# Patient Record
Sex: Male | Born: 1950 | Race: White | Hispanic: No | Smoking: Never smoker
Health system: Southern US, Community
[De-identification: ages and names within clinical notes are randomized; demographics above are authoritative.]

## PROBLEM LIST (undated history)

## (undated) DIAGNOSIS — E785 Hyperlipidemia, unspecified: Secondary | ICD-10-CM

## (undated) DIAGNOSIS — I10 Essential (primary) hypertension: Secondary | ICD-10-CM

## (undated) DIAGNOSIS — E119 Type 2 diabetes mellitus without complications: Secondary | ICD-10-CM

## (undated) HISTORY — PX: TONSILLECTOMY: SUR1361

## (undated) HISTORY — PX: FINGER AMPUTATION: SHX636

---

## 2010-03-01 ENCOUNTER — Encounter (INDEPENDENT_AMBULATORY_CARE_PROVIDER_SITE_OTHER): Payer: Self-pay | Admitting: *Deleted

## 2010-09-17 NOTE — Letter (Signed)
Summary: [facility] Referral (unable to schedule) Notification  [facility]  [address] [place], [place] [address]   Phone: [phone]  Fax: [phone]      March 01, 2010 Martin Leach 04/22/1951 MRN: [id number]   [facility] [address] [place] [address] [address] [place], [place]  [address]   Dear Dr. Clelia Croft:   Thank you for your kind referral of the above patient. We have attempted to schedule the recommended Colonoscopy but have been unable to schedule because:  _x_ The patient was not available by phone and/or has not returned our calls.  __ The patient declined to schedule the procedure at this time.  We appreciate the referral and hope that we will have the opportunity to treat this patient in the future.    Sincerely,   [facility]  Vania Rea. Jarold Motto M.D. Hedwig Morton. Juanda Chance M.D. Venita Lick. Russella Dar M.D. Wilhemina Bonito. Marina Goodell M.D. Barbette Hair. Arlyce Dice M.D. Iva Boop M.D. Cheron Every.D.

## 2013-12-15 DIAGNOSIS — H3589 Other specified retinal disorders: Secondary | ICD-10-CM | POA: Diagnosis not present

## 2013-12-15 DIAGNOSIS — H52229 Regular astigmatism, unspecified eye: Secondary | ICD-10-CM | POA: Diagnosis not present

## 2013-12-15 DIAGNOSIS — H5200 Hypermetropia, unspecified eye: Secondary | ICD-10-CM | POA: Diagnosis not present

## 2013-12-15 DIAGNOSIS — H524 Presbyopia: Secondary | ICD-10-CM | POA: Diagnosis not present

## 2014-01-03 ENCOUNTER — Encounter: Payer: Self-pay | Admitting: Internal Medicine

## 2014-02-22 ENCOUNTER — Encounter: Payer: Self-pay | Admitting: Internal Medicine

## 2014-04-19 DIAGNOSIS — M25569 Pain in unspecified knee: Secondary | ICD-10-CM | POA: Diagnosis not present

## 2014-05-02 DIAGNOSIS — E1169 Type 2 diabetes mellitus with other specified complication: Secondary | ICD-10-CM | POA: Diagnosis not present

## 2014-05-02 DIAGNOSIS — Z23 Encounter for immunization: Secondary | ICD-10-CM | POA: Diagnosis not present

## 2014-05-02 DIAGNOSIS — M25569 Pain in unspecified knee: Secondary | ICD-10-CM | POA: Diagnosis not present

## 2014-05-02 DIAGNOSIS — E785 Hyperlipidemia, unspecified: Secondary | ICD-10-CM | POA: Diagnosis not present

## 2014-05-02 DIAGNOSIS — I10 Essential (primary) hypertension: Secondary | ICD-10-CM | POA: Diagnosis not present

## 2016-08-21 DIAGNOSIS — E784 Other hyperlipidemia: Secondary | ICD-10-CM | POA: Diagnosis not present

## 2016-08-21 DIAGNOSIS — R609 Edema, unspecified: Secondary | ICD-10-CM | POA: Diagnosis not present

## 2016-08-21 DIAGNOSIS — Z6841 Body Mass Index (BMI) 40.0 and over, adult: Secondary | ICD-10-CM | POA: Diagnosis not present

## 2016-08-21 DIAGNOSIS — E1129 Type 2 diabetes mellitus with other diabetic kidney complication: Secondary | ICD-10-CM | POA: Diagnosis not present

## 2016-08-21 DIAGNOSIS — I10 Essential (primary) hypertension: Secondary | ICD-10-CM | POA: Diagnosis not present

## 2016-10-02 DIAGNOSIS — S82402A Unspecified fracture of shaft of left fibula, initial encounter for closed fracture: Secondary | ICD-10-CM | POA: Diagnosis not present

## 2016-10-03 DIAGNOSIS — S82892A Other fracture of left lower leg, initial encounter for closed fracture: Secondary | ICD-10-CM | POA: Diagnosis not present

## 2016-10-03 DIAGNOSIS — M25562 Pain in left knee: Secondary | ICD-10-CM | POA: Diagnosis not present

## 2016-10-21 DIAGNOSIS — H5203 Hypermetropia, bilateral: Secondary | ICD-10-CM | POA: Diagnosis not present

## 2016-10-21 DIAGNOSIS — Z7984 Long term (current) use of oral hypoglycemic drugs: Secondary | ICD-10-CM | POA: Diagnosis not present

## 2016-10-21 DIAGNOSIS — E119 Type 2 diabetes mellitus without complications: Secondary | ICD-10-CM | POA: Diagnosis not present

## 2016-10-21 DIAGNOSIS — H35033 Hypertensive retinopathy, bilateral: Secondary | ICD-10-CM | POA: Diagnosis not present

## 2016-10-21 DIAGNOSIS — H52223 Regular astigmatism, bilateral: Secondary | ICD-10-CM | POA: Diagnosis not present

## 2016-10-21 DIAGNOSIS — H2513 Age-related nuclear cataract, bilateral: Secondary | ICD-10-CM | POA: Diagnosis not present

## 2016-10-21 DIAGNOSIS — H40033 Anatomical narrow angle, bilateral: Secondary | ICD-10-CM | POA: Diagnosis not present

## 2016-10-21 DIAGNOSIS — H25013 Cortical age-related cataract, bilateral: Secondary | ICD-10-CM | POA: Diagnosis not present

## 2016-10-21 DIAGNOSIS — H25043 Posterior subcapsular polar age-related cataract, bilateral: Secondary | ICD-10-CM | POA: Diagnosis not present

## 2016-10-21 DIAGNOSIS — Z794 Long term (current) use of insulin: Secondary | ICD-10-CM | POA: Diagnosis not present

## 2016-10-21 DIAGNOSIS — H04123 Dry eye syndrome of bilateral lacrimal glands: Secondary | ICD-10-CM | POA: Diagnosis not present

## 2016-10-21 DIAGNOSIS — I10 Essential (primary) hypertension: Secondary | ICD-10-CM | POA: Diagnosis not present

## 2016-10-31 DIAGNOSIS — M25572 Pain in left ankle and joints of left foot: Secondary | ICD-10-CM | POA: Diagnosis not present

## 2016-10-31 DIAGNOSIS — S82832G Other fracture of upper and lower end of left fibula, subsequent encounter for closed fracture with delayed healing: Secondary | ICD-10-CM | POA: Diagnosis not present

## 2016-11-07 DIAGNOSIS — S8262XD Displaced fracture of lateral malleolus of left fibula, subsequent encounter for closed fracture with routine healing: Secondary | ICD-10-CM | POA: Diagnosis not present

## 2016-11-21 DIAGNOSIS — S8262XD Displaced fracture of lateral malleolus of left fibula, subsequent encounter for closed fracture with routine healing: Secondary | ICD-10-CM | POA: Diagnosis not present

## 2016-12-19 DIAGNOSIS — Z6841 Body Mass Index (BMI) 40.0 and over, adult: Secondary | ICD-10-CM | POA: Diagnosis not present

## 2016-12-19 DIAGNOSIS — J3089 Other allergic rhinitis: Secondary | ICD-10-CM | POA: Diagnosis not present

## 2016-12-19 DIAGNOSIS — E784 Other hyperlipidemia: Secondary | ICD-10-CM | POA: Diagnosis not present

## 2016-12-19 DIAGNOSIS — S8262XD Displaced fracture of lateral malleolus of left fibula, subsequent encounter for closed fracture with routine healing: Secondary | ICD-10-CM | POA: Diagnosis not present

## 2016-12-19 DIAGNOSIS — I10 Essential (primary) hypertension: Secondary | ICD-10-CM | POA: Diagnosis not present

## 2016-12-19 DIAGNOSIS — S82402A Unspecified fracture of shaft of left fibula, initial encounter for closed fracture: Secondary | ICD-10-CM | POA: Diagnosis not present

## 2016-12-19 DIAGNOSIS — E1129 Type 2 diabetes mellitus with other diabetic kidney complication: Secondary | ICD-10-CM | POA: Diagnosis not present

## 2017-05-07 DIAGNOSIS — E784 Other hyperlipidemia: Secondary | ICD-10-CM | POA: Diagnosis not present

## 2017-05-07 DIAGNOSIS — Z125 Encounter for screening for malignant neoplasm of prostate: Secondary | ICD-10-CM | POA: Diagnosis not present

## 2017-05-07 DIAGNOSIS — E1129 Type 2 diabetes mellitus with other diabetic kidney complication: Secondary | ICD-10-CM | POA: Diagnosis not present

## 2017-05-07 DIAGNOSIS — I10 Essential (primary) hypertension: Secondary | ICD-10-CM | POA: Diagnosis not present

## 2017-05-07 DIAGNOSIS — N390 Urinary tract infection, site not specified: Secondary | ICD-10-CM | POA: Diagnosis not present

## 2017-05-07 DIAGNOSIS — R8299 Other abnormal findings in urine: Secondary | ICD-10-CM | POA: Diagnosis not present

## 2017-05-14 DIAGNOSIS — Z6841 Body Mass Index (BMI) 40.0 and over, adult: Secondary | ICD-10-CM | POA: Diagnosis not present

## 2017-05-14 DIAGNOSIS — E1129 Type 2 diabetes mellitus with other diabetic kidney complication: Secondary | ICD-10-CM | POA: Diagnosis not present

## 2017-05-14 DIAGNOSIS — I10 Essential (primary) hypertension: Secondary | ICD-10-CM | POA: Diagnosis not present

## 2017-05-14 DIAGNOSIS — S82402S Unspecified fracture of shaft of left fibula, sequela: Secondary | ICD-10-CM | POA: Diagnosis not present

## 2017-05-14 DIAGNOSIS — Z0000 Encounter for general adult medical examination without abnormal findings: Secondary | ICD-10-CM | POA: Diagnosis not present

## 2017-05-14 DIAGNOSIS — E784 Other hyperlipidemia: Secondary | ICD-10-CM | POA: Diagnosis not present

## 2017-05-14 DIAGNOSIS — R609 Edema, unspecified: Secondary | ICD-10-CM | POA: Diagnosis not present

## 2017-05-14 DIAGNOSIS — Z23 Encounter for immunization: Secondary | ICD-10-CM | POA: Diagnosis not present

## 2017-05-14 DIAGNOSIS — Z1389 Encounter for screening for other disorder: Secondary | ICD-10-CM | POA: Diagnosis not present

## 2017-06-09 DIAGNOSIS — R3129 Other microscopic hematuria: Secondary | ICD-10-CM | POA: Diagnosis not present

## 2017-06-25 DIAGNOSIS — R32 Unspecified urinary incontinence: Secondary | ICD-10-CM | POA: Diagnosis not present

## 2017-06-25 DIAGNOSIS — N390 Urinary tract infection, site not specified: Secondary | ICD-10-CM | POA: Diagnosis not present

## 2017-09-11 DIAGNOSIS — E7849 Other hyperlipidemia: Secondary | ICD-10-CM | POA: Diagnosis not present

## 2017-09-11 DIAGNOSIS — I10 Essential (primary) hypertension: Secondary | ICD-10-CM | POA: Diagnosis not present

## 2017-09-11 DIAGNOSIS — E1129 Type 2 diabetes mellitus with other diabetic kidney complication: Secondary | ICD-10-CM | POA: Diagnosis not present

## 2017-09-11 DIAGNOSIS — Z6841 Body Mass Index (BMI) 40.0 and over, adult: Secondary | ICD-10-CM | POA: Diagnosis not present

## 2017-10-21 DIAGNOSIS — R310 Gross hematuria: Secondary | ICD-10-CM | POA: Diagnosis not present

## 2017-10-21 DIAGNOSIS — N390 Urinary tract infection, site not specified: Secondary | ICD-10-CM | POA: Diagnosis not present

## 2017-12-26 ENCOUNTER — Other Ambulatory Visit: Payer: Self-pay

## 2017-12-26 ENCOUNTER — Inpatient Hospital Stay (HOSPITAL_COMMUNITY)
Admission: EM | Admit: 2017-12-26 | Discharge: 2017-12-29 | DRG: 872 | Disposition: A | Discharge status: Home Health | Payer: PPO | Attending: Internal Medicine | Admitting: Internal Medicine

## 2017-12-26 ENCOUNTER — Encounter (HOSPITAL_COMMUNITY): Payer: Self-pay

## 2017-12-26 ENCOUNTER — Emergency Department (HOSPITAL_COMMUNITY): Payer: PPO

## 2017-12-26 DIAGNOSIS — D649 Anemia, unspecified: Secondary | ICD-10-CM | POA: Diagnosis present

## 2017-12-26 DIAGNOSIS — E785 Hyperlipidemia, unspecified: Secondary | ICD-10-CM | POA: Diagnosis not present

## 2017-12-26 DIAGNOSIS — R748 Abnormal levels of other serum enzymes: Secondary | ICD-10-CM | POA: Diagnosis not present

## 2017-12-26 DIAGNOSIS — E861 Hypovolemia: Secondary | ICD-10-CM

## 2017-12-26 DIAGNOSIS — Z8249 Family history of ischemic heart disease and other diseases of the circulatory system: Secondary | ICD-10-CM | POA: Diagnosis not present

## 2017-12-26 DIAGNOSIS — R000 Tachycardia, unspecified: Secondary | ICD-10-CM

## 2017-12-26 DIAGNOSIS — I2510 Atherosclerotic heart disease of native coronary artery without angina pectoris: Secondary | ICD-10-CM | POA: Diagnosis not present

## 2017-12-26 DIAGNOSIS — I10 Essential (primary) hypertension: Secondary | ICD-10-CM | POA: Diagnosis present

## 2017-12-26 DIAGNOSIS — N179 Acute kidney failure, unspecified: Secondary | ICD-10-CM | POA: Diagnosis not present

## 2017-12-26 DIAGNOSIS — D72825 Bandemia: Secondary | ICD-10-CM

## 2017-12-26 DIAGNOSIS — Z7984 Long term (current) use of oral hypoglycemic drugs: Secondary | ICD-10-CM

## 2017-12-26 DIAGNOSIS — Z79899 Other long term (current) drug therapy: Secondary | ICD-10-CM | POA: Diagnosis not present

## 2017-12-26 DIAGNOSIS — Z7982 Long term (current) use of aspirin: Secondary | ICD-10-CM | POA: Diagnosis not present

## 2017-12-26 DIAGNOSIS — A419 Sepsis, unspecified organism: Secondary | ICD-10-CM | POA: Diagnosis not present

## 2017-12-26 DIAGNOSIS — E1169 Type 2 diabetes mellitus with other specified complication: Secondary | ICD-10-CM | POA: Diagnosis not present

## 2017-12-26 DIAGNOSIS — R778 Other specified abnormalities of plasma proteins: Secondary | ICD-10-CM

## 2017-12-26 DIAGNOSIS — I959 Hypotension, unspecified: Secondary | ICD-10-CM | POA: Diagnosis present

## 2017-12-26 DIAGNOSIS — E876 Hypokalemia: Secondary | ICD-10-CM | POA: Diagnosis present

## 2017-12-26 DIAGNOSIS — R296 Repeated falls: Secondary | ICD-10-CM | POA: Diagnosis not present

## 2017-12-26 DIAGNOSIS — N390 Urinary tract infection, site not specified: Secondary | ICD-10-CM | POA: Diagnosis present

## 2017-12-26 DIAGNOSIS — R7989 Other specified abnormal findings of blood chemistry: Secondary | ICD-10-CM

## 2017-12-26 DIAGNOSIS — W19XXXA Unspecified fall, initial encounter: Secondary | ICD-10-CM | POA: Diagnosis not present

## 2017-12-26 DIAGNOSIS — E669 Obesity, unspecified: Secondary | ICD-10-CM

## 2017-12-26 DIAGNOSIS — Z6841 Body Mass Index (BMI) 40.0 and over, adult: Secondary | ICD-10-CM

## 2017-12-26 DIAGNOSIS — R531 Weakness: Secondary | ICD-10-CM | POA: Diagnosis not present

## 2017-12-26 DIAGNOSIS — E860 Dehydration: Secondary | ICD-10-CM | POA: Diagnosis not present

## 2017-12-26 DIAGNOSIS — E872 Acidosis, unspecified: Secondary | ICD-10-CM | POA: Diagnosis present

## 2017-12-26 DIAGNOSIS — Z89022 Acquired absence of left finger(s): Secondary | ICD-10-CM

## 2017-12-26 DIAGNOSIS — I9589 Other hypotension: Secondary | ICD-10-CM | POA: Diagnosis present

## 2017-12-26 DIAGNOSIS — R404 Transient alteration of awareness: Secondary | ICD-10-CM | POA: Diagnosis not present

## 2017-12-26 DIAGNOSIS — Y92009 Unspecified place in unspecified non-institutional (private) residence as the place of occurrence of the external cause: Secondary | ICD-10-CM

## 2017-12-26 DIAGNOSIS — R0989 Other specified symptoms and signs involving the circulatory and respiratory systems: Secondary | ICD-10-CM | POA: Diagnosis not present

## 2017-12-26 DIAGNOSIS — I2699 Other pulmonary embolism without acute cor pulmonale: Secondary | ICD-10-CM | POA: Diagnosis not present

## 2017-12-26 HISTORY — DX: Essential (primary) hypertension: I10

## 2017-12-26 HISTORY — DX: Hyperlipidemia, unspecified: E78.5

## 2017-12-26 HISTORY — DX: Type 2 diabetes mellitus without complications: E11.9

## 2017-12-26 LAB — COMPREHENSIVE METABOLIC PANEL
ALBUMIN: 3.2 g/dL — AB (ref 3.5–5.0)
ALK PHOS: 66 U/L (ref 38–126)
ALT: 25 U/L (ref 17–63)
AST: 25 U/L (ref 15–41)
Anion gap: 14 (ref 5–15)
BILIRUBIN TOTAL: 0.4 mg/dL (ref 0.3–1.2)
BUN: 29 mg/dL — AB (ref 6–20)
CO2: 22 mmol/L (ref 22–32)
CREATININE: 1.46 mg/dL — AB (ref 0.61–1.24)
Calcium: 8.9 mg/dL (ref 8.9–10.3)
Chloride: 105 mmol/L (ref 101–111)
GFR calc Af Amer: 55 mL/min — ABNORMAL LOW (ref >60)
GFR, EST NON AFRICAN AMERICAN: 47 mL/min — AB (ref >60)
GLUCOSE: 297 mg/dL — AB (ref 65–99)
POTASSIUM: 3.7 mmol/L (ref 3.5–5.1)
Sodium: 141 mmol/L (ref 135–145)
TOTAL PROTEIN: 6.6 g/dL (ref 6.5–8.1)

## 2017-12-26 LAB — GLUCOSE, CAPILLARY: GLUCOSE-CAPILLARY: 250 mg/dL — AB (ref 65–99)

## 2017-12-26 LAB — CBC WITH DIFFERENTIAL/PLATELET
BASOS ABS: 0 10*3/uL (ref 0.0–0.1)
Basophils Relative: 0 %
EOS PCT: 0 %
Eosinophils Absolute: 0 10*3/uL (ref 0.0–0.7)
HCT: 37.8 % — ABNORMAL LOW (ref 39.0–52.0)
Hemoglobin: 12.1 g/dL — ABNORMAL LOW (ref 13.0–17.0)
LYMPHS ABS: 0.9 10*3/uL (ref 0.7–4.0)
Lymphocytes Relative: 6 %
MCH: 31.2 pg (ref 26.0–34.0)
MCHC: 32 g/dL (ref 30.0–36.0)
MCV: 97.4 fL (ref 78.0–100.0)
MONO ABS: 0.9 10*3/uL (ref 0.1–1.0)
MONOS PCT: 6 %
Neutro Abs: 13.2 10*3/uL — ABNORMAL HIGH (ref 1.7–7.7)
Neutrophils Relative %: 88 %
PLATELETS: 282 10*3/uL (ref 150–400)
RBC: 3.88 MIL/uL — ABNORMAL LOW (ref 4.22–5.81)
RDW: 14.7 % (ref 11.5–15.5)
WBC: 15 10*3/uL — ABNORMAL HIGH (ref 4.0–10.5)

## 2017-12-26 LAB — URINALYSIS, ROUTINE W REFLEX MICROSCOPIC
Bilirubin Urine: NEGATIVE
GLUCOSE, UA: 50 mg/dL — AB
KETONES UR: NEGATIVE mg/dL
Nitrite: NEGATIVE
PROTEIN: 30 mg/dL — AB
Specific Gravity, Urine: 1.019 (ref 1.005–1.030)
WBC, UA: >50 WBC/hpf — ABNORMAL HIGH (ref 0–5)
pH: 5 (ref 5.0–8.0)

## 2017-12-26 LAB — BRAIN NATRIURETIC PEPTIDE: B NATRIURETIC PEPTIDE 5: 88.4 pg/mL (ref 0.0–100.0)

## 2017-12-26 LAB — CBG MONITORING, ED: Glucose-Capillary: 273 mg/dL — ABNORMAL HIGH (ref 65–99)

## 2017-12-26 LAB — TROPONIN I: Troponin I: 0.07 ng/mL (ref <0.03)

## 2017-12-26 LAB — I-STAT TROPONIN, ED: Troponin i, poc: 0.05 ng/mL (ref 0.00–0.08)

## 2017-12-26 LAB — I-STAT CG4 LACTIC ACID, ED: LACTIC ACID, VENOUS: 3.84 mmol/L — AB (ref 0.5–1.9)

## 2017-12-26 LAB — LACTIC ACID, PLASMA: LACTIC ACID, VENOUS: 2 mmol/L — AB (ref 0.5–1.9)

## 2017-12-26 LAB — D-DIMER, QUANTITATIVE: D-Dimer, Quant: 2.79 ug/mL-FEU — ABNORMAL HIGH (ref 0.00–0.50)

## 2017-12-26 LAB — LIPASE, BLOOD: Lipase: 24 U/L (ref 11–51)

## 2017-12-26 MED ORDER — VANCOMYCIN HCL 10 G IV SOLR: 1750.0000 mg | INTRAVENOUS | Status: DC

## 2017-12-26 MED ORDER — PIPERACILLIN-TAZOBACTAM 3.375 G IVPB 30 MIN
3.3750 g | Freq: Once | INTRAVENOUS | Status: AC
  Administered 2017-12-26: 3.375 g via INTRAVENOUS
  Filled 2017-12-26: qty 50

## 2017-12-26 MED ORDER — INSULIN ASPART 100 UNIT/ML ~~LOC~~ SOLN: 0.0000 [IU] | Freq: Every day | SUBCUTANEOUS | Status: DC

## 2017-12-26 MED ORDER — SODIUM CHLORIDE 0.9 % IV BOLUS
1000.0000 mL | Freq: Once | INTRAVENOUS | Status: AC
  Administered 2017-12-26: 1000 mL via INTRAVENOUS

## 2017-12-26 MED ORDER — SODIUM CHLORIDE 0.9 % IV SOLN
INTRAVENOUS | Status: AC
  Administered 2017-12-26: 22:00:00 via INTRAVENOUS

## 2017-12-26 MED ORDER — PIPERACILLIN-TAZOBACTAM 3.375 G IVPB
3.3750 g | Freq: Three times a day (TID) | INTRAVENOUS | Status: DC
  Administered 2017-12-27 – 2017-12-29 (×7): 3.375 g via INTRAVENOUS
  Filled 2017-12-26 (×7): qty 50

## 2017-12-26 MED ORDER — ROSUVASTATIN CALCIUM 20 MG PO TABS
40.0000 mg | ORAL_TABLET | Freq: Every day | ORAL | Status: DC
  Administered 2017-12-27 – 2017-12-29 (×3): 40 mg via ORAL
  Filled 2017-12-26 (×5): qty 2

## 2017-12-26 MED ORDER — INSULIN ASPART 100 UNIT/ML ~~LOC~~ SOLN
0.0000 [IU] | Freq: Three times a day (TID) | SUBCUTANEOUS | Status: DC
  Administered 2017-12-27 – 2017-12-28 (×2): 2 [IU] via SUBCUTANEOUS
  Administered 2017-12-28: 3 [IU] via SUBCUTANEOUS

## 2017-12-26 MED ORDER — ENOXAPARIN SODIUM 80 MG/0.8ML ~~LOC~~ SOLN
80.0000 mg | Freq: Every day | SUBCUTANEOUS | Status: DC
  Administered 2017-12-26: 80 mg via SUBCUTANEOUS
  Filled 2017-12-26: qty 0.8

## 2017-12-26 MED ORDER — VANCOMYCIN HCL IN DEXTROSE 1-5 GM/200ML-% IV SOLN
1000.0000 mg | Freq: Once | INTRAVENOUS | Status: AC
  Administered 2017-12-26: 1000 mg via INTRAVENOUS
  Filled 2017-12-26: qty 200

## 2017-12-26 MED ORDER — ACETAMINOPHEN 325 MG PO TABS
650.0000 mg | ORAL_TABLET | Freq: Four times a day (QID) | ORAL | Status: DC | PRN
  Administered 2017-12-26 – 2017-12-28 (×4): 650 mg via ORAL
  Filled 2017-12-26 (×4): qty 2

## 2017-12-26 MED ORDER — ACETAMINOPHEN 650 MG RE SUPP: 650.0000 mg | Freq: Four times a day (QID) | RECTAL | Status: DC | PRN

## 2017-12-26 MED ORDER — ASPIRIN EC 81 MG PO TBEC
81.0000 mg | DELAYED_RELEASE_TABLET | Freq: Every day | ORAL | Status: DC
  Administered 2017-12-27: 81 mg via ORAL
  Filled 2017-12-26: qty 1

## 2017-12-26 NOTE — ED Triage Notes (Signed)
Pt's. Wife called EMS d/t pt. Larey Seat in his home. He denies any specific injury. He tells [place] he has fallen three times recently for "unknown" reasons. EMS found his initial sbp of 73. After of IVF, his sbp 92. Pt. Denies fever/cough, nor any other sign of current illness.

## 2017-12-26 NOTE — Progress Notes (Signed)
Pharmacy Antibiotic Note  Martin Leach is a 67 y.o. male admitted on 12/26/2017 after falling at home. Also chills and showing signs of sepsis. Pharmacy has been consulted for Vanc and Zosyn dosing. First doses have been given in the ED.  Plan: Start Zosyn 3.375g IV Q8H infused over 4hrs. Give Vancomycin 1g in addition to 1g already given for a 2g loading dose,  Then start Vanc  IV q24h.  Measure Vanc peak and trough at steady state. Target AUC 400-500. Follow up renal fxn, culture results, and clinical course.   Height:  (185.4 cm) Weight: (!) 391 lb (177.4 kg) IBW/kg (Calculated) : 79.9  Temp (24hrs), Avg:98.3 F (36.8 C), Min:98.3 F (36.8 C), Max:98.3 F (36.8 C)  Recent Labs  Lab 12/26/17 1612 12/26/17 1622  WBC  --  15.0*  CREATININE  --  1.46*  LATICACIDVEN 3.84*  --     Estimated Creatinine Clearance: 80.3 mL/min (A) (by C-G formula based on SCr of 1.46 mg/dL (H)).    Allergies  Allergen Reactions  . Sulfa Antibiotics     Antimicrobials this admission: Zosyn 5/11 >>  Vanc 5/11 >>   Dose adjustments this admission:   Microbiology results: BCx: UCx:  Sputum:   MRSA PCR:   Thank you for allowing pharmacy to be a part of this patient's care.  Charolotte Eke, PharmD. Mobile: [phone]. 12/26/2017,8:50 PM.

## 2017-12-26 NOTE — ED Notes (Signed)
MADE 1ST URINE REQUEST PQTIENT UNABLE TO PROVIDE ONE AT THIS TIME

## 2017-12-26 NOTE — ED Notes (Signed)
No respiratory or acute distress noted alert and oriented x 3 family at bedside no reaction to medication noted. 

## 2017-12-26 NOTE — H&P (Addendum)
TRH H&P   Patient Demographics:    Martin Leach, is a 67 y.o. male  MRN: [id number]   DOB - Feb 18, 1951  Admit Date - 12/26/2017  Outpatient Primary MD for the patient is Martha Clan, MD  Referring MD/NP/PA:  Toni Arthurs (orthopedics)  Outpatient Specialists:   Patient coming from: home  Chief Complaint  Patient presents with  . Fall      HPI:    Martin Leach  is a 67 y.o. male, w hypertension, hyperlipidemia, dm2, presents with chills and fall while getting out of bed. Pt presented due to fall.  He scraped his legs.  Pt denies fever, cough, syncope, cp, palp, sob, n/v, diarrhea, dysuria, hematuria.  In ED,  CXR IMPRESSION: Cardiomegaly with mild pulmonary vascular congestion.  Lactic 3.84 Wbc 15.0, Hgb 12.1, Plt 282 Bun 29, Creatinine 1.46 Ast 25, Alt 25 Alb 3.2 Glucose 297 Lipase 24 BNP 88.4 Trop negative  Blood culture x2 negative D dimer pending  Pt will be admitted for chills/ shaking and hypotension and renal insufficiency/ dehydration   Review of systems:    In addition to the HPI above, No Fever-chills, No Headache, No changes with Vision or hearing, No problems swallowing food or Liquids, No Chest pain, Cough or Shortness of Breath, No Abdominal pain, No Nausea or Vommitting, Bowel movements are regular, No Blood in stool or Urine, No dysuria, + rug burn No new joints pains-aches,  No new weakness, tingling, numbness in any extremity, No recent weight gain or loss, No polyuria, polydypsia or polyphagia, No significant Mental Stressors.  A full 10 point Review of Systems was done, except as stated above, all other Review of Systems were negative.   With Past History of the following :    Past Medical History:  Diagnosis Date  . Diabetes mellitus without complication (HCC)   . Hyperlipidemia   . Hypertension       Past Surgical  History:  Procedure Laterality Date  . 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 TONSILLECTOMY        Social History:     Social History   Tobacco Use  . Smoking status: Never Smoker  . Smokeless tobacco: Never Used  Substance Use Topics  . Alcohol use: Yes    Comment: occasional cold beer     Lives - at home  Mobility - walks by self   Family History :     Family History  Problem Relation Age of Onset  . Heart attack Mother   . Stroke Mother   . Stroke Father       Home Medications:   Prior to Admission medications   Medication Sig Start Date End Date Taking? Authorizing Provider  aspirin EC 81 MG tablet Take 81 mg by mouth daily.   Yes [provider]  Dulaglutide (TRULICITY [place]) Inject into the skin.  Yes [provider]  lisinopril-hydrochlorothiazide (PRINZIDE,ZESTORETIC) 20-12.5 MG tablet Take 1 tablet by mouth daily.  11/30/17  Yes [provider]  metFORMIN (GLUCOPHAGE) 1000 MG tablet Take 1,000 mg by mouth daily with breakfast.   Yes [provider]  Multiple Vitamins-Minerals (MULTIVITAMIN ADULTS) TABS Take 1 tablet by mouth daily.   Yes [provider]  pioglitazone (ACTOS) 45 MG tablet Take 45 mg by mouth daily.  11/30/17  Yes [provider]  rosuvastatin (CRESTOR) 40 MG tablet Take 40 mg by mouth daily.  12/14/17  Yes [provider]     Allergies:     Allergies  Allergen Reactions  . Sulfa Antibiotics      Physical Exam:   Vitals  Blood pressure 106/60, pulse 93, temperature 98.3 F (36.8 C), temperature source Oral, resp. rate 19, height  (1.854 m), weight (!) 177.4 kg (391 lb), SpO2 97 %.   1. General  lying in bed in NAD,    2. Normal affect and insight, Not Suicidal or Homicidal, Awake Alert, Oriented X 3.  3. No F.N deficits, ALL C.Nerves Intact, Strength 5/5 all 4 extremities, Sensation intact all 4 extremities, Plantars down going.  4. Ears and Eyes appear  Normal, Conjunctivae clear, PERRLA. Moist Oral Mucosa.  5. Supple Neck, No JVD, No cervical lymphadenopathy appriciated, No Carotid Bruits.  6. Symmetrical Chest wall movement, Good air movement bilaterally, CTAB.  7. RRR, No Gallops, Rubs or Murmurs, No Parasternal Heave.  8. Positive Bowel Sounds, Abdomen Soft, No tenderness, No organomegaly appriciated,No rebound -guarding or rigidity.  9.  No Cyanosis, Normal Skin Turgor, No Skin Rash or Bruise.  10. Good muscle tone,  joints appear normal , no effusions, Normal ROM.  11. No Palpable Lymph Nodes in Neck or Axillae  Scrape over the bilateral    Data Review:    CBC Recent Labs  Lab 12/26/17 1622  WBC 15.0*  HGB 12.1*  HCT 37.8*  PLT 282  MCV 97.4  MCH 31.2  MCHC 32.0  RDW 14.7  LYMPHSABS 0.9  MONOABS 0.9  EOSABS 0.0  BASOSABS 0.0   ------------------------------------------------------------------------------------------------------------------  Chemistries  Recent Labs  Lab 12/26/17 1622  NA 141  K 3.7  CL 105  CO2 22  GLUCOSE 297*  BUN 29*  CREATININE 1.46*  CALCIUM 8.9  AST 25  ALT 25  ALKPHOS 66  BILITOT 0.4   ------------------------------------------------------------------------------------------------------------------ estimated creatinine clearance is 80.3 mL/min (A) (by C-G formula based on SCr of 1.46 mg/dL (H)). ------------------------------------------------------------------------------------------------------------------ No results for input(s): TSH, T4TOTAL, T3FREE, THYROIDAB in the last 72 hours.  Invalid input(s): FREET3  Coagulation profile No results for input(s): INR, PROTIME in the last 168 hours. ------------------------------------------------------------------------------------------------------------------- No results for input(s): DDIMER in the last 72  hours. -------------------------------------------------------------------------------------------------------------------  Cardiac Enzymes No results for input(s): CKMB, TROPONINI, MYOGLOBIN in the last 168 hours.  Invalid input(s): CK ------------------------------------------------------------------------------------------------------------------    Component Value Date/Time   BNP 88.4 12/26/2017 1622     ---------------------------------------------------------------------------------------------------------------  Urinalysis No results found for: COLORURINE, APPEARANCEUR, LABSPEC, PHURINE, GLUCOSEU, HGBUR, BILIRUBINUR, KETONESUR, PROTEINUR, UROBILINOGEN, NITRITE, LEUKOCYTESUR  ----------------------------------------------------------------------------------------------------------------   Imaging Results:    Dg Chest Port 1 View  Result Date: 12/26/2017 CLINICAL DATA:  Tachycardia EXAM: PORTABLE CHEST 1 VIEW COMPARISON:  None. FINDINGS: Cardiomegaly and mild pulmonary vascular congestion noted. There is no evidence of focal airspace disease, pulmonary edema, suspicious pulmonary nodule/mass, pleural effusion, or pneumothorax. No acute bony abnormalities are identified. IMPRESSION: Cardiomegaly with mild pulmonary vascular congestion. Electronically  Signed   By: Harmon Pier M.D.   On: 12/26/2017 18:02    STat 100, borderline LAD, early R progression no st-t changes c/w ischemia    Assessment & Plan:    Active Problems:   Hypotension    Hypotension Tele Cortisol Trop I q6h x3 Check cardiac echo  Tachycardia Check tsh Check D dimer If D dimer is positive then consider VQ scan  Chills/ shaking  ? Early sepsis,  (wbc elevation, tachycardia, elevation in LA Blood culture x2 Urinalysis pending Vanco, zosyn iv pharmacy to dose for now  Dm2 STOP metformin due to renal insufficiency STOP actos for now as well as trulicity fsbs ac and qhs, ISS  Lactic acidosis  might be due to being on metformin and renal insufficiency Check lactic acid in am   Hypertension HOLD Lisinopril/ hydrochlorothiazide due to hypotension, and dehydration  Hyperlipidemia Cont crestor  po qhs      DVT Prophylaxis  Lovenox - SCDs  AM Labs Ordered, also please review Full Orders  Family Communication: Admission, patients condition and plan of care including tests being ordered have been discussed with the patient who indicate understanding and agree with the plan and Code Status.  Code Status FULLCODE  Likely DC to  home  Condition GUARDED    Consults called: none  Admission status: inpatient  Time spent in minutes : 45  Pearson Grippe M.D on 12/26/2017 at 7:32 PM  Between 7am to 7pm - Pager - [phone]. After 7pm go to [email or web address] - password [facility]  [facility] - Office  [phone]

## 2017-12-26 NOTE — Progress Notes (Signed)
A consult was received from an ED physician for vanc/Zosyn per pharmacy dosing.  The patient's profile has been reviewed for ht/wt/allergies/indication/available labs.   A one time order has been placed for Vanc 1g and Zosyn 3.375g.  Further antibiotics/pharmacy consults should be ordered by admitting physician if indicated.                       Thank you, Berkley Harvey 12/26/2017  5:35 PM

## 2017-12-26 NOTE — ED Notes (Signed)
ED TO INPATIENT HANDOFF REPORT  Name/Age/Gender Martin Leach 67 y.o. male  Code Status   Home/SNF/Other Home  Chief Complaint hypotension  Level of Care/Admitting Diagnosis ED Disposition    ED Disposition Condition [facility] Area: [place] [100102]  Level of Care: Stepdown [14]  Admit to SDU based on following criteria: Hemodynamic compromise or significant risk of instability:  Patient requiring short term acute titration and management of vasoactive drips, and invasive monitoring (i.e., CVP and Arterial line).  Diagnosis: Hypotension [494496]  Admitting Physician: Jani Gravel [[id number]]  Attending Physician: Jani Gravel [phone]  Estimated length of stay: past midnight tomorrow  Certification:: I certify this patient will need inpatient services for at least 2 midnights  PT Class (Do Not Modify): Inpatient [101]  PT Acc Code (Do Not Modify): Private [1]       Medical History Past Medical History:  Diagnosis Date  . Diabetes mellitus without complication ([place])   . Hypertension     Allergies Allergies  Allergen Reactions  . Sulfa Antibiotics     IV Location/Drains/Wounds Patient Lines/Drains/Airways Status   Active Line/Drains/Airways    Name:   Placement date:   Placement time:   Site:   Days:   Peripheral IV 12/26/17 Right Antecubital   12/26/17    1745    Antecubital   less than 1          Labs/Imaging Results for orders placed or performed during the hospital encounter of 12/26/17 (from the past 48 hour(s))  I-stat troponin, ED     Status: None   Collection Time: 12/26/17  4:11 PM  Result Value Ref Range   Troponin i, poc 0.05 0.00 - 0.08 ng/mL   Comment 3            Comment: Due to the release kinetics of cTnI, a negative result within the first hours of the onset of symptoms does not rule out myocardial infarction with certainty. If myocardial infarction is still suspected, repeat the test at appropriate  intervals.   I-Stat CG4 Lactic Acid, ED     Status: Abnormal   Collection Time: 12/26/17  4:12 PM  Result Value Ref Range   Lactic Acid, Venous 3.84 (HH) 0.5 - 1.9 mmol/L   Comment NOTIFIED PHYSICIAN   CBC with Differential/Platelet     Status: Abnormal   Collection Time: 12/26/17  4:22 PM  Result Value Ref Range   WBC 15.0 (H) 4.0 - 10.5 K/uL   RBC 3.88 (L) 4.22 - 5.81 MIL/uL   Hemoglobin 12.1 (L) 13.0 - 17.0 g/dL   HCT 37.8 (L) 39.0 - 52.0 %   MCV 97.4 78.0 - 100.0 fL   MCH 31.2 26.0 - 34.0 pg   MCHC 32.0 30.0 - 36.0 g/dL   RDW 14.7 11.5 - 15.5 %   Platelets 282 150 - 400 K/uL   Neutrophils Relative % 88 %   Neutro Abs 13.2 (H) 1.7 - 7.7 K/uL   Lymphocytes Relative 6 %   Lymphs Abs 0.9 0.7 - 4.0 K/uL   Monocytes Relative 6 %   Monocytes Absolute 0.9 0.1 - 1.0 K/uL   Eosinophils Relative 0 %   Eosinophils Absolute 0.0 0.0 - 0.7 K/uL   Basophils Relative 0 %   Basophils Absolute 0.0 0.0 - 0.1 K/uL    Comment: Performed at [facility], [place] [address], [place], [place] [address]  Comprehensive metabolic panel     Status: Abnormal  Collection Time: 12/26/17  4:22 PM  Result Value Ref Range   Sodium 141 135 - 145 mmol/L   Potassium 3.7 3.5 - 5.1 mmol/L   Chloride 105 101 - 111 mmol/L   CO2 22 22 - 32 mmol/L   Glucose, Bld 297 (H) 65 - 99 mg/dL   BUN 29 (H) 6 - 20 mg/dL   Creatinine, Ser 1.46 (H) 0.61 - 1.24 mg/dL   Calcium 8.9 8.9 - 10.3 mg/dL   Total Protein 6.6 6.5 - 8.1 g/dL   Albumin 3.2 (L) 3.5 - 5.0 g/dL   AST 25 15 - 41 U/L   ALT 25 17 - 63 U/L   Alkaline Phosphatase 66 38 - 126 U/L   Total Bilirubin 0.4 0.3 - 1.2 mg/dL   GFR calc non Af Amer 47 (L) >60 mL/min   GFR calc Af Amer 55 (L) >60 mL/min    Comment: (NOTE) The eGFR has been calculated using the CKD EPI equation. This calculation has not been validated in all clinical situations. eGFR's persistently <60 mL/min signify possible Chronic Kidney Disease.    Anion gap 14 5 - 15     Comment: Performed at [facility], [place] [address] [place], [place] [address]  Lipase, blood     Status: None   Collection Time: 12/26/17  4:22 PM  Result Value Ref Range   Lipase 24 11 - 51 U/L    Comment: Performed at [facility], [place] [address], [place], [place] [address]  Brain natriuretic peptide     Status: None   Collection Time: 12/26/17  4:22 PM  Result Value Ref Range   B Natriuretic Peptide 88.4 0.0 - 100.0 pg/mL    Comment: Performed at [facility], [place] [address], [place],  [address]  CBG monitoring, ED     Status: Abnormal   Collection Time: 12/26/17  4:26 PM  Result Value Ref Range   Glucose-Capillary 273 (H) 65 - 99 mg/dL   Dg Chest Port 1 View  Result Date: 12/26/2017 CLINICAL DATA:  Tachycardia EXAM: PORTABLE CHEST 1 VIEW COMPARISON:  None. FINDINGS: Cardiomegaly and mild pulmonary vascular congestion noted. There is no evidence of focal airspace disease, pulmonary edema, suspicious pulmonary nodule/mass, pleural effusion, or pneumothorax. No acute bony abnormalities are identified. IMPRESSION: Cardiomegaly with mild pulmonary vascular congestion. Electronically Signed   By: Margarette [place] M.D.   On: 12/26/2017 18:02    Pending Labs Unresulted Labs (From admission, onward)   Start     Ordered   12/26/17 1908  Cortisol  Once,   R     12/26/17 1907   12/26/17 1907  D-dimer, quantitative (not at [facility])  Add-on,   R     12/26/17 1906   12/26/17 1907  Troponin I (q 6hr x 3)  Now then every 6 hours,   R     12/26/17 1906   12/26/17 1729  Blood Culture (routine x 2)  BLOOD CULTURE X 2,   STAT     12/26/17 1729   12/26/17 1622  Urinalysis, Routine w reflex microscopic  Once,   R     12/26/17 1621      Vitals/Pain Today's Vitals   12/26/17 1724 12/26/17 1730 12/26/17 1745 12/26/17 1800  BP:  (!) 102/57  106/60  Pulse:  98 97 93  Resp:  '20 19 19  ' Temp: 98.3 F (36.8 C)     TempSrc: Oral     SpO2:  95%  97% 97%  Weight:      Height:      PainSc:        Isolation Precautions No active isolations  Medications Medications  sodium chloride 0.9 % bolus 1,000 mL (has no administration in time range)  sodium chloride 0.9 % bolus 1,000 mL (0 mLs Intravenous Stopped 12/26/17 1910)  sodium chloride 0.9 % bolus 1,000 mL (0 mLs Intravenous Stopped 12/26/17 1909)  piperacillin-tazobactam (ZOSYN) IVPB 3.375 g (0 g Intravenous Stopped 12/26/17 1910)  vancomycin (VANCOCIN) IVPB 1000 mg/200 mL premix (0 mg Intravenous Stopped 12/26/17 1910)    Mobility walks

## 2017-12-26 NOTE — Progress Notes (Signed)
Lovenox per Pharmacy for DVT Prophylaxis    Pharmacy has been consulted from dosing enoxaparin (lovenox) in this patient for DVT prophylaxis.  The pharmacist has reviewed pertinent labs (Hgb _12.1__; PLT_282__), patient weight (_177__kg) and renal function (CrCl_80__mL/min) and decided that enoxaparin _80_mg SQ Q24Hrs is appropriate for this patient.  The pharmacy department will sign off at this time.  Please reconsult pharmacy if status changes or for further issues.  Thank you  Luetta Nutting PharmD, BCPS  12/26/2017, 9:29 PM

## 2017-12-26 NOTE — ED Provider Notes (Signed)
[facility]-EMERGENCY DEPT Provider Note  CSN: [id number] Arrival date & time: 12/26/17 1534  Chief Complaint(s) Fall  HPI Martin Leach is a 67 y.o. male with a history of hypertension and diabetes who presents to the emergency department after near syncopal episode.  Patient reports that over the past several weeks he said approximately 4 falls associated with lightheadedness.  States that he try to get out of bed earlier today and slid down onto the floor.  Family had difficulty getting him up and EMS was called.  They noted that the patient was hypotensive with systolics in the 70s.  Patient denies any associated headache, chest pain, shortness of breath, nausea, vomiting, abdominal pain, diarrhea.  Denies any urinary symptoms.  Denies any melena or hematochezia.  Denies any recent alcohol use, illicit drug use.  Denies take any narcotic or benzodiazepines.  HPI  Past Medical History Past Medical History:  Diagnosis Date  . Diabetes mellitus without complication (HCC)   . Hypertension    There are no active problems to display for this patient.  Home Medication(s) Prior to Admission medications   Medication Sig Start Date End Date Taking? Authorizing Provider  aspirin EC 81 MG tablet Take 81 mg by mouth daily.   Yes [provider]  Dulaglutide (TRULICITY [place]) Inject into the skin.   Yes [provider]  lisinopril-hydrochlorothiazide (PRINZIDE,ZESTORETIC) 20-12.5 MG tablet Take 1 tablet by mouth daily.  11/30/17  Yes [provider]  metFORMIN (GLUCOPHAGE) 1000 MG tablet Take 1,000 mg by mouth daily with breakfast.   Yes [provider]  Multiple Vitamins-Minerals (MULTIVITAMIN ADULTS) TABS Take 1 tablet by mouth daily.   Yes [provider]  pioglitazone (ACTOS) 45 MG tablet Take 45 mg by mouth daily.  11/30/17  Yes [provider]  rosuvastatin (CRESTOR) 40 MG tablet Take 40 mg by mouth daily.  12/14/17  Yes  [provider]                                                                                                                                    Past Surgical History ** The histories are not reviewed yet. Please review them in the "History" navigator section and refresh this SmartLink. Family History History reviewed. No pertinent family history.  Social History Social History   Tobacco Use  . Smoking status: Never Smoker  . Smokeless tobacco: Never Used  Substance Use Topics  . Alcohol use: Yes    Comment: occasional cold beer  . Drug use: Never   Allergies Sulfa antibiotics  Review of Systems Review of Systems All other systems are reviewed and are negative for acute change except as noted in the HPI  Physical Exam Vital Signs  I have reviewed the triage vital signs BP (!) 85/47 (BP Location: Left Arm)   Pulse (!) 104   Resp 20   Ht  (1.854 m)  Wt (!) 177.4 kg (391 lb)   SpO2 96%   BMI 51.59 kg/m   Physical Exam  Constitutional: He is oriented to person, place, and time. He appears well-developed and well-nourished. No distress.  Obese  HENT:  Head: Normocephalic and atraumatic.  Nose: Nose normal.  Eyes: Pupils are equal, round, and reactive to light. Conjunctivae and EOM are normal. Right eye exhibits no discharge. Left eye exhibits no discharge. No scleral icterus.  Neck: Normal range of motion. Neck supple.  Cardiovascular: Regular rhythm. Tachycardia present. Exam reveals no gallop and no friction rub.  No murmur heard. Pulmonary/Chest: Effort normal and breath sounds normal. No stridor. No respiratory distress. He has no rales.  Abdominal: Soft. He exhibits no distension. There is no tenderness.  Musculoskeletal: He exhibits no edema or tenderness.  Neurological: He is alert and oriented to person, place, and time.  Skin: Skin is warm and dry. No rash noted. He is not diaphoretic. No erythema.  Psychiatric: He has a normal mood and affect.   Vitals reviewed.   ED Results and Treatments Labs (all labs ordered are listed, but only abnormal results are displayed) Labs Reviewed  CBC WITH DIFFERENTIAL/PLATELET - Abnormal; Notable for the following components:      Result Value   WBC 15.0 (*)    RBC 3.88 (*)    Hemoglobin 12.1 (*)    HCT 37.8 (*)    Neutro Abs 13.2 (*)    All other components within normal limits  COMPREHENSIVE METABOLIC PANEL - Abnormal; Notable for the following components:   Glucose, Bld 297 (*)    BUN 29 (*)    Creatinine, Ser 1.46 (*)    Albumin 3.2 (*)    GFR calc non Af Amer 47 (*)    GFR calc Af Amer 55 (*)    All other components within normal limits  I-STAT CG4 LACTIC ACID, ED - Abnormal; Notable for the following components:   Lactic Acid, Venous 3.84 (*)    All other components within normal limits  CBG MONITORING, ED - Abnormal; Notable for the following components:   Glucose-Capillary 273 (*)    All other components within normal limits  CULTURE, BLOOD (ROUTINE X 2)  CULTURE, BLOOD (ROUTINE X 2)  LIPASE, BLOOD  BRAIN NATRIURETIC PEPTIDE  URINALYSIS, ROUTINE W REFLEX MICROSCOPIC  I-STAT TROPONIN, ED  I-STAT TROPONIN, ED                                                                                                                         EKG  EKG Interpretation  Date/Time:  Saturday Dec 26 2017 15:51:55 EDT Ventricular Rate:  102 PR Interval:    QRS Duration: 108 QT Interval:  375 QTC Calculation: 489 R Axis:   -27 Text Interpretation:  Sinus tachycardia Borderline left axis deviation Low voltage, precordial leads Abnormal R-wave progression, early transition Borderline prolonged QT interval NO STEMI No old tracing to compare Confirmed by Drema Pry [phone]) on 12/26/2017  4:59:22 PM      Radiology Dg Chest Port 1 View  Result Date: 12/26/2017 CLINICAL DATA:  Tachycardia EXAM: PORTABLE CHEST 1 VIEW COMPARISON:  None. FINDINGS: Cardiomegaly and mild pulmonary vascular  congestion noted. There is no evidence of focal airspace disease, pulmonary edema, suspicious pulmonary nodule/mass, pleural effusion, or pneumothorax. No acute bony abnormalities are identified. IMPRESSION: Cardiomegaly with mild pulmonary vascular congestion. Electronically Signed   By: Harmon Pier M.D.   On: 12/26/2017 18:02   Pertinent labs & imaging results that were available during my care of the patient were reviewed by me and considered in my medical decision making (see chart for details).  Medications Ordered in ED Medications  vancomycin (VANCOCIN) IVPB 1000 mg/200 mL premix (1,000 mg Intravenous New Bag/Given 12/26/17 1758)  sodium chloride 0.9 % bolus 1,000 mL (has no administration in time range)  sodium chloride 0.9 % bolus 1,000 mL (1,000 mLs Intravenous New Bag/Given 12/26/17 1725)  sodium chloride 0.9 % bolus 1,000 mL (1,000 mLs Intravenous New Bag/Given 12/26/17 1650)  piperacillin-tazobactam (ZOSYN) IVPB 3.375 g (3.375 g Intravenous New Bag/Given 12/26/17 1758)                                                                                                                                    Procedures Procedures Emergency Focused Ultrasound Exam Limited Ultrasound Assessment for the evaluation of Hypotension (RUSH PROTOCOL)  Performed and interpreted by Dr. Eudelia Bunch Indication: Hypotension Multiple images of the bilateral lungs, heart, inferior vena cava, abdomen, and abdominal aorta are obtained for the purposes of estimating presence/absence of pneumothorax, cardiac contractility, volume status, abdominal free fluid and aortic aneurysm with a multifrequency probe. Findings:  NO anechoic fluid in abdomen, NORMAL cardiac contractility, NO anechoic fluid surrounding heart, NOTABLE IVC collapse, NO aortic dilation Interpretation: NO hemoperitoneum, NO pericardial effusion, depressed CVP, NOabdominal aortic aneurysm Images archived electronically.  CPT Codes: thorax [address],   cardiac [id number], abdomen [phone], limited retroperitoneal [phone] (study includes all codes)   (including critical care time)  CRITICAL CARE Performed by: Amadeo Garnet Jody Silas Total critical care time: 75 minutes Critical care time was exclusive of separately billable procedures and treating other patients. Critical care was necessary to treat or prevent imminent or life-threatening deterioration. Critical care was time spent personally by me on the following activities: development of treatment plan with patient and/or surrogate as well as nursing, discussions with consultants, evaluation of patient's response to treatment, examination of patient, obtaining history from patient or surrogate, ordering and performing treatments and interventions, ordering and review of laboratory studies, ordering and review of radiographic studies, pulse oximetry and re-evaluation of patient's condition.    Medical Decision Making / ED Course I have reviewed the nursing notes for this encounter and the patient's prior records (if available in EHR or on provided paperwork).     Patient is afebrile, tachycardic, hypotensive.  Denies any and infectious symptoms.  Reports frequent falls  with lower back pain.  No lower extremity with weakness or loss of sensation.  Denies any associated chest pain or shortness of breath.    Bedside ultrasound with good cardiac contractility.  IVC was collapsing concerning for hypovolemia.  Patient denied any GI bleed.  Labs revealed leukocytosis, elevated lactic acid.  Hemoglobin stable.  EKG without acute ischemic changes or evidence of pericarditis.  30 cc/kg of ideal weight IVF and BP responded well.  Code sepsis was initiated and patient was started on empiric antibiotics for unknown source, in case this was secondary to infectious process given his history of diabetes.  Does not appear to be cardiac etiology.  Abdomen benign does doubt intra-abdominal process.  Patient is   without neuro deficits and thus I doubt neurogenic shock.  Patient denied any head trauma and he is oriented x4 this I doubt intracranial bleed.  Chest x-ray without evidence of pneumonia. UA still pending.  Repeat POCUS with improved IVC volume.  We will discussed the case with medicine for admission, further work-up and management.  Final Clinical Impression(s) / ED Diagnoses Final diagnoses:  Hypotension due to hypovolemia  Lactic acidosis  Bandemia      This chart was dictated using voice recognition software.  Despite best efforts to proofread,  errors can occur which can change the documentation meaning.   Nira Conn, MD 12/26/17 [phone]

## 2017-12-26 NOTE — ED Notes (Signed)
MD AND RN NOTIFIED OF PATIENT'S LACTIC ACID LEVEL OF 3.84

## 2017-12-27 ENCOUNTER — Inpatient Hospital Stay (HOSPITAL_COMMUNITY): Payer: PPO

## 2017-12-27 DIAGNOSIS — R778 Other specified abnormalities of plasma proteins: Secondary | ICD-10-CM

## 2017-12-27 DIAGNOSIS — A419 Sepsis, unspecified organism: Secondary | ICD-10-CM

## 2017-12-27 DIAGNOSIS — N390 Urinary tract infection, site not specified: Secondary | ICD-10-CM

## 2017-12-27 DIAGNOSIS — W19XXXA Unspecified fall, initial encounter: Secondary | ICD-10-CM

## 2017-12-27 DIAGNOSIS — R748 Abnormal levels of other serum enzymes: Secondary | ICD-10-CM

## 2017-12-27 DIAGNOSIS — I10 Essential (primary) hypertension: Secondary | ICD-10-CM

## 2017-12-27 DIAGNOSIS — E785 Hyperlipidemia, unspecified: Secondary | ICD-10-CM

## 2017-12-27 DIAGNOSIS — R7989 Other specified abnormal findings of blood chemistry: Secondary | ICD-10-CM

## 2017-12-27 DIAGNOSIS — Z6841 Body Mass Index (BMI) 40.0 and over, adult: Secondary | ICD-10-CM

## 2017-12-27 DIAGNOSIS — E1169 Type 2 diabetes mellitus with other specified complication: Secondary | ICD-10-CM

## 2017-12-27 DIAGNOSIS — Y92009 Unspecified place in unspecified non-institutional (private) residence as the place of occurrence of the external cause: Secondary | ICD-10-CM

## 2017-12-27 DIAGNOSIS — R000 Tachycardia, unspecified: Secondary | ICD-10-CM

## 2017-12-27 DIAGNOSIS — E669 Obesity, unspecified: Secondary | ICD-10-CM

## 2017-12-27 LAB — COMPREHENSIVE METABOLIC PANEL
ALBUMIN: 2.7 g/dL — AB (ref 3.5–5.0)
ALT: 31 U/L (ref 17–63)
ANION GAP: 11 (ref 5–15)
AST: 65 U/L — ABNORMAL HIGH (ref 15–41)
Alkaline Phosphatase: 51 U/L (ref 38–126)
BUN: 30 mg/dL — ABNORMAL HIGH (ref 6–20)
CO2: 21 mmol/L — AB (ref 22–32)
Calcium: 8 mg/dL — ABNORMAL LOW (ref 8.9–10.3)
Chloride: 108 mmol/L (ref 101–111)
Creatinine, Ser: 1.2 mg/dL (ref 0.61–1.24)
GFR calc non Af Amer: 60 mL/min — ABNORMAL LOW (ref >60)
GLUCOSE: 187 mg/dL — AB (ref 65–99)
POTASSIUM: 3.4 mmol/L — AB (ref 3.5–5.1)
SODIUM: 140 mmol/L (ref 135–145)
Total Bilirubin: 0.4 mg/dL (ref 0.3–1.2)
Total Protein: 5.5 g/dL — ABNORMAL LOW (ref 6.5–8.1)

## 2017-12-27 LAB — TROPONIN I: TROPONIN I: 0.04 ng/mL — AB (ref <0.03)

## 2017-12-27 LAB — BASIC METABOLIC PANEL
ANION GAP: 10 (ref 5–15)
BUN: 28 mg/dL — ABNORMAL HIGH (ref 6–20)
CALCIUM: 8.2 mg/dL — AB (ref 8.9–10.3)
CO2: 23 mmol/L (ref 22–32)
Chloride: 108 mmol/L (ref 101–111)
Creatinine, Ser: 1.09 mg/dL (ref 0.61–1.24)
GLUCOSE: 187 mg/dL — AB (ref 65–99)
POTASSIUM: 3.6 mmol/L (ref 3.5–5.1)
Sodium: 141 mmol/L (ref 135–145)

## 2017-12-27 LAB — CBC
HEMATOCRIT: 32.7 % — AB (ref 39.0–52.0)
HEMOGLOBIN: 10.4 g/dL — AB (ref 13.0–17.0)
MCH: 30.7 pg (ref 26.0–34.0)
MCHC: 31.8 g/dL (ref 30.0–36.0)
MCV: 96.5 fL (ref 78.0–100.0)
Platelets: 229 10*3/uL (ref 150–400)
RBC: 3.39 MIL/uL — AB (ref 4.22–5.81)
RDW: 15 % (ref 11.5–15.5)
WBC: 16.9 10*3/uL — ABNORMAL HIGH (ref 4.0–10.5)

## 2017-12-27 LAB — PHOSPHORUS: PHOSPHORUS: 2.6 mg/dL (ref 2.5–4.6)

## 2017-12-27 LAB — ECHOCARDIOGRAM COMPLETE
HEIGHTINCHES: 73 in
WEIGHTICAEL: 6256 [oz_av]

## 2017-12-27 LAB — LACTIC ACID, PLASMA: LACTIC ACID, VENOUS: 1.3 mmol/L (ref 0.5–1.9)

## 2017-12-27 LAB — GLUCOSE, CAPILLARY
GLUCOSE-CAPILLARY: 218 mg/dL — AB (ref 65–99)
GLUCOSE-CAPILLARY: 150 mg/dL — AB (ref 65–99)
GLUCOSE-CAPILLARY: 222 mg/dL — AB (ref 65–99)
Glucose-Capillary: 182 mg/dL — ABNORMAL HIGH (ref 65–99)

## 2017-12-27 LAB — MAGNESIUM: Magnesium: 1.6 mg/dL — ABNORMAL LOW (ref 1.7–2.4)

## 2017-12-27 LAB — CORTISOL: CORTISOL PLASMA: 12.9 ug/dL

## 2017-12-27 LAB — MRSA PCR SCREENING: MRSA by PCR: NEGATIVE

## 2017-12-27 MED ORDER — VANCOMYCIN HCL 10 G IV SOLR
2000.0000 mg | INTRAVENOUS | Status: DC
  Filled 2017-12-27: qty 2000

## 2017-12-27 MED ORDER — IOPAMIDOL (ISOVUE-370) INJECTION 76%
INTRAVENOUS | Status: AC
  Filled 2017-12-27: qty 100

## 2017-12-27 MED ORDER — ENOXAPARIN SODIUM 300 MG/3ML IJ SOLN
180.0000 mg | Freq: Two times a day (BID) | INTRAMUSCULAR | Status: DC
  Administered 2017-12-27 – 2017-12-28 (×2): 180 mg via SUBCUTANEOUS
  Filled 2017-12-27 (×2): qty 1.8

## 2017-12-27 MED ORDER — IOPAMIDOL (ISOVUE-370) INJECTION 76%
100.0000 mL | Freq: Once | INTRAVENOUS | Status: AC | PRN
  Administered 2017-12-27: 100 mL via INTRAVENOUS

## 2017-12-27 MED ORDER — LORAZEPAM 2 MG/ML IJ SOLN
2.0000 mg | Freq: Once | INTRAMUSCULAR | Status: AC | PRN
  Administered 2017-12-27: 2 mg via INTRAVENOUS

## 2017-12-27 MED ORDER — POTASSIUM CHLORIDE CRYS ER 20 MEQ PO TBCR
40.0000 meq | EXTENDED_RELEASE_TABLET | Freq: Two times a day (BID) | ORAL | Status: AC
  Administered 2017-12-27 (×2): 40 meq via ORAL
  Filled 2017-12-27 (×2): qty 2

## 2017-12-27 MED ORDER — LORAZEPAM 2 MG/ML IJ SOLN
INTRAMUSCULAR | Status: AC
  Administered 2017-12-27: 2 mg via INTRAVENOUS
  Filled 2017-12-27: qty 1

## 2017-12-27 MED ORDER — MAGNESIUM SULFATE 2 GM/50ML IV SOLN
2.0000 g | Freq: Once | INTRAVENOUS | Status: AC
  Administered 2017-12-27: 2 g via INTRAVENOUS
  Filled 2017-12-27: qty 50

## 2017-12-27 NOTE — Progress Notes (Signed)
Vonita Moss (sister) called, updated to patients status with allowance for all questions to be asked and answered. Agree with POC.

## 2017-12-27 NOTE — Progress Notes (Signed)
Spoke with [employer], [facility] @ [phone]. The table limit is 370 lbs. The patient weighs 390.28 lbs. Text sent to Dr. Balinda Quails.

## 2017-12-27 NOTE — Progress Notes (Signed)
  Echocardiogram 2D Echocardiogram has been performed.  Leta Jungling M 12/27/2017, 11:34 AM

## 2017-12-27 NOTE — Progress Notes (Signed)
PROGRESS NOTE    [name]  WUJ:[id number] DOB: December 02, 1950 DOA: 12/26/2017 PCP: Martha Clan, MD   Brief Narrative:  Martin Leach  is a morbidly obese 67 y.o. male, w hypertension, hyperlipidemia, DM2, and other comorbids who presented with chills and fall while getting out of bed. Pt presented due to fall.  He scraped his legs. Pt admitted for chills/ shaking and hypotension and renal insufficiency/ dehydration with Sepsis 2/2 to suspected UTI. Di-Dimer incidentally noted to be elevated so will be ruling out PE.  Assessment & Plan:   Active Problems:   Hypotension   Lactic acidosis   Fall at home, initial encounter   Tachycardia   Diabetes mellitus type 2 in obese [facility])   Essential hypertension   Hyperlipidemia associated with type 2 diabetes mellitus (HCC)   Elevated d-dimer   Morbid obesity with BMI of 50.0-59.9, adult (HCC)   Elevated troponin   Hypomagnesemia   Sepsis secondary to UTI [facility])  Recent Fall -Unclear Etiology  -PT/OT Evaluate and Treat   Sepsis likely 2/2 to Suspected UTI -LA was 3.84 and trended down to 1.2 -Blood Culture x2 pending  -CXR showed Cardiomegaly and mild pulmonary vascular congestion noted. There is no evidence of focal airspace disease, pulmonary edema, suspicious pulmonary nodule/mass, pleural effusion, or pneumothorax. No acute bony abnormalities are identified. -Urinalysis was cloudy, showed 50 glucose, large hemoglobin, negative ketones, large leukocytes, negative nitrites, many bacteria, and greater than 50 WBCs -Unfortunately urine culture was never obtained and have ordered one again  -Started on empiric IV vancomycin and IV Zosyn however MRSA PCR was negative so we will stop IV Vancomycin -WBC went from 15.0 -> 16.9 -Continue with IV Zosyn for now  Hypotension, likely from Infection  -C/w Telemetry  -Plasma Cortisol was 12.9 this morning -Trop I q6h x3; Initial was 0.07 and trending down to -> 0.04 -Cardiac ECHOCardiogram as  below -Given 3 Liters IVF Boluses and started on Maintenance at 75 mL/hr -BP improved but on the softer side at 114/61  Tachycardia, improved  -TSH pending  -Checked D Dimer and was 2.79 -V/Q Scan Unable to be done due to patient's body habitus  -Now that Cr is improving will get CTA of Chest to r/o PE -Improved with IVF Rehydration; C/w IVF Hydration as above  DM Type 2 -Stopped Metformin 1000 mg po Daily due to AKI and Lactic Acidosis -Hold Home Pioglitazone 45 mg po Daily for now as well as Home Dulaglutide -C/w Sensitive Novolog SSI AC/HS -CBG's ranging from 182-218 -Check HbA1c  Hypertension but currently BP on softer side  -Hold Lisinopril/Hydrochlorothiazide 20-12.5 mg Daily due to hypotension, dehydration, and AKI  Hyperlipidemia -C/w Rosuvastatin 40 mg po Daily   Hypokalemia -She is potassium this morning was 3.4 and is now improved to 3.6 -Replete with p.o. potassium chloride 40 mg daily x2 -Continue to monitor and replete as necessary -Repeat CMP in a.m.  Lactic Acidosis -Setting of Metformin usage and renal insufficiency as well as infectious etiology -Improved.  Lactic acid level went from 3.84 -> 1.3 now -Was given 3 L of normal saline and then placed on maintenance IV fluids with normal saline at 75 mL's per hour -We will not check further lactic acid levels  Elevated D-Dimer -D-dimer on admission was 2.79 -Unfortunately unable to get a VQ scan given patient's morbid obesity and inability to fit in the VQ scan or -Because creatinine has improved since admission we will order a CTA of Chest to r/o PE and  continue with IV fluid hydration at 75 mL per hour  Morbid Obesity  -BMI on admission was 51.59 -Weight loss counseling given  Elevated Troponin -Likely in the setting of AKI as well as infection -Doubt ACS as troponin went from 0.07 and is now trended down to 0.04 -Currently denies any chest pain -Checking ECHOCardiogram and showed EF of 60-65%  with no Regional Wall Motion Abnormalities  -Repeat EKG in the morning and continued to finish trending troponins  Hypomagnesemia -Patient magnesium level was 1.6 -Replete with IV mag sulfate 2 g -Continue to monitor and replete as necessary -Repeat Magnesium level in a.m.  DVT prophylaxis: Coagulated 1 mg/kg of enoxaparin subcu every 12 hours Code Status: FULL CODE Family Communication: No family present at bedside  Disposition Plan: Remain inpatient for current work-up and treatment  Consultants:   None   Procedures:  ECHOCARDIOGRAM  ------------------------------------------------------------------- Study Conclusions  - Left ventricle: The cavity size was normal. There was mild   concentric hypertrophy. Systolic function was normal. The   estimated ejection fraction was in the range of 60% to 65%. Wall   motion was normal; there were no regional wall motion   abnormalities. Left ventricular diastolic function parameters   were normal. - Atrial septum: No defect or patent foramen ovale was identified.   Antimicrobials:  Anti-infectives (From admission, onward)   Start     Dose/Rate Route Frequency Ordered Stop   12/27/17 2200  vancomycin (VANCOCIN) 1,750 mg in sodium chloride 0.9 % 500 mL IVPB  Status:  Discontinued     1,750 mg 250 mL/hr over 120 Minutes Intravenous Every 24 hours 12/26/17 2109 12/27/17 1134   12/27/17 2200  vancomycin (VANCOCIN) 2,000 mg in sodium chloride 0.9 % 500 mL IVPB  Status:  Discontinued     2,000 mg 250 mL/hr over 120 Minutes Intravenous Every 24 hours 12/27/17 1134 12/27/17 1515   12/27/17 0400  piperacillin-tazobactam (ZOSYN) IVPB 3.375 g     3.375 g 12.5 mL/hr over 240 Minutes Intravenous Every 8 hours 12/26/17 2109     12/26/17 2200  vancomycin (VANCOCIN) IVPB 1000 mg/200 mL premix     1,000 mg 200 mL/hr over 60 Minutes Intravenous  Once 12/26/17 2109 12/26/17 2314   12/26/17 1730  piperacillin-tazobactam (ZOSYN) IVPB 3.375 g      3.375 g 100 mL/hr over 30 Minutes Intravenous  Once 12/26/17 1729 12/26/17 1910   12/26/17 1730  vancomycin (VANCOCIN) IVPB 1000 mg/200 mL premix     1,000 mg 200 mL/hr over 60 Minutes Intravenous  Once 12/26/17 1729 12/26/17 1910     Subjective: Seen and examined at bedside and felt okay.  No chest pain, nausea, vomiting.  Denied any shortness of breath.  Denied any burning or discomfort in urine however did admit to having some urinary frequency.  No other complaints or concerns at this time  Objective: Vitals:   12/27/17 0900 12/27/17 1000 12/27/17 1100 12/27/17 1200  BP: 118/82 129/84 114/61   Pulse: 79 79 80 80  Resp: 18     Temp:    98.2 F (36.8 C)  TempSrc:    Oral  SpO2: 98% 98% 100% 95%  Weight:      Height:        Intake/Output Summary (Last 24 hours) at 12/27/2017 1551 Last data filed at 12/27/2017 1500 Gross per 24 hour  Intake 4703.75 ml  Output 1025 ml  Net 3678.75 ml   Filed Weights   12/26/17 1552 12/26/17 1553  Weight: Marland Kitchen)  177.4 kg (391 lb) (!) 177.4 kg (391 lb)   Examination: Physical Exam:  Constitutional: WN/WD morbidly obese Caucasian male in NAD and appears calm and comfortable Eyes: Lids and conjunctivae normal, sclerae anicteric  ENMT: External Ears, Nose appear normal. Grossly normal hearing.  Neck: Appears normal, supple, no cervical masses, normal ROM, no appreciable thyromegaly; No appreciable JVD  Respiratory: Diminished to auscultation bilaterally, no wheezing, rales, rhonchi or crackles. Normal respiratory effort and patient is not tachypenic. No accessory muscle use.  Cardiovascular: RRR, no murmurs / rubs / gallops. S1 and S2 auscultated. 1+ LE Edema  Abdomen: Soft, non-tender, Distended due to body habitus. No masses palpated. No appreciable hepatosplenomegaly. Bowel sounds positive x4.  GU: Deferred. Musculoskeletal: No clubbing / cyanosis of digits/nails. No joint deformity upper and lower extremities.  Skin:  No induration; Warm and  dry. Has scrapes on the LE bilaterally Neurologic: CN 2-12 grossly intact with no focal deficits. Romberg sign and cerebellar reflexes not assessed.  Psychiatric: Normal judgment and insight. Alert and oriented x 3. Normal mood and appropriate affect.   Data Reviewed: I have personally reviewed following labs and imaging studies  CBC: Recent Labs  Lab 12/26/17 1622 12/27/17 0340  WBC 15.0* 16.9*  NEUTROABS 13.2*  --   HGB 12.1* 10.4*  HCT 37.8* 32.7*  MCV 97.4 96.5  PLT 282 229   Basic Metabolic Panel: Recent Labs  Lab 12/26/17 1622 12/27/17 0340 12/27/17 1030  NA 141 140 141  K 3.7 3.4* 3.6  CL 105 108 108  CO2 22 21* 23  GLUCOSE 297* 187* 187*  BUN 29* 30* 28*  CREATININE 1.46* 1.20 1.09  CALCIUM 8.9 8.0* 8.2*  MG  --   --  1.6*  PHOS  --   --  2.6   GFR: Estimated Creatinine Clearance: 107.6 mL/min (by C-G formula based on SCr of 1.09 mg/dL). Liver Function Tests: Recent Labs  Lab 12/26/17 1622 12/27/17 0340  AST 25 65*  ALT 25 31  ALKPHOS 66 51  BILITOT 0.4 0.4  PROT 6.6 5.5*  ALBUMIN 3.2* 2.7*   Recent Labs  Lab 12/26/17 1622  LIPASE 24   No results for input(s): AMMONIA in the last 168 hours. Coagulation Profile: No results for input(s): INR, PROTIME in the last 168 hours. Cardiac Enzymes: Recent Labs  Lab 12/26/17 2217 12/27/17 1030  TROPONINI 0.07* 0.04*   BNP (last 3 results) No results for input(s): PROBNP in the last 8760 hours. HbA1C: No results for input(s): HGBA1C in the last 72 hours. CBG: Recent Labs  Lab 12/26/17 1626 12/26/17 2200 12/27/17 0801 12/27/17 1158  GLUCAP 273* 250* 182* 218*   Lipid Profile: No results for input(s): CHOL, HDL, LDLCALC, TRIG, CHOLHDL, LDLDIRECT in the last 72 hours. Thyroid Function Tests: No results for input(s): TSH, T4TOTAL, FREET4, T3FREE, THYROIDAB in the last 72 hours. Anemia Panel: No results for input(s): VITAMINB12, FOLATE, FERRITIN, TIBC, IRON, RETICCTPCT in the last 72  hours. Sepsis Labs: Recent Labs  Lab 12/26/17 1612 12/26/17 2217 12/27/17 0147  LATICACIDVEN 3.84* 2.0* 1.3    Recent Results (from the past 240 hour(s))  MRSA PCR Screening     Status: None   Collection Time: 12/26/17  9:39 PM  Result Value Ref Range Status   MRSA by PCR NEGATIVE NEGATIVE Final    Comment:        The GeneXpert MRSA Assay (FDA approved for NASAL specimens only), is one component of a comprehensive MRSA colonization surveillance program. It is  not intended to diagnose MRSA infection nor to guide or monitor treatment for MRSA infections. Performed at [facility], [address] [place], [place] [address]     Radiology Studies: Dg Chest Port 1 View  Result Date: 12/26/2017 CLINICAL DATA:  Tachycardia EXAM: PORTABLE CHEST 1 VIEW COMPARISON:  None. FINDINGS: Cardiomegaly and mild pulmonary vascular congestion noted. There is no evidence of focal airspace disease, pulmonary edema, suspicious pulmonary nodule/mass, pleural effusion, or pneumothorax. No acute bony abnormalities are identified. IMPRESSION: Cardiomegaly with mild pulmonary vascular congestion. Electronically Signed   By: Harmon Pier M.D.   On: 12/26/2017 18:02   Scheduled Meds: . aspirin EC  81 mg Oral Daily  . enoxaparin (LOVENOX) injection  180 mg Subcutaneous Q12H  . insulin aspart  0-5 Units Subcutaneous QHS  . insulin aspart  0-9 Units Subcutaneous TID WC  . potassium chloride  40 mEq Oral BID  . rosuvastatin  40 mg Oral Daily   Continuous Infusions: . sodium chloride 75 mL/hr at 12/26/17 2153  . magnesium sulfate 1 - 4 g bolus IVPB    . piperacillin-tazobactam (ZOSYN)  IV 3.375 g (12/27/17 1400)    LOS: 1 day   Merlene Laughter, DO [facility] Pager [phone]  If 7PM-7AM, please contact night-coverage [email or web address] Password [facility] 12/27/2017, 3:51 PM

## 2017-12-27 NOTE — Progress Notes (Signed)
Pharmacy Antibiotic Note  Martin Leach is a 67 y.o. male admitted on 12/26/2017 after falling at home. Also chills and showing signs of sepsis. Pharmacy has been consulted for Vanc and Zosyn dosing. First doses have been given in the ED.  Plan: Zosyn 3.375g IV Q8H infused over 4hrs. Vancomycin 2000 mg IV q24h. Daily SCr Check vancomycin levels if remains on vancomycin > 3-4 days.  Goal AUC 400-500. Follow up renal fxn, culture results, and clinical course. F/u ability to de-escalate antibiotics.   Height:  (185.4 cm) Weight: (!) 391 lb (177.4 kg) IBW/kg (Calculated) : 79.9  Temp (24hrs), Avg:98 F (36.7 C), Min:97.7 F (36.5 C), Max:98.3 F (36.8 C)  Recent Labs  Lab 12/26/17 1612 12/26/17 1622 12/26/17 2217 12/27/17 0147 12/27/17 0340  WBC  --  15.0*  --   --  16.9*  CREATININE  --  1.46*  --   --  1.20  LATICACIDVEN 3.84*  --  2.0* 1.3  --     Estimated Creatinine Clearance: 97.7 mL/min (by C-G formula based on SCr of 1.2 mg/dL).    Allergies  Allergen Reactions  . Sulfa Antibiotics     Antimicrobials this admission: Zosyn 5/11 >>  Vanc 5/11 >>   Dose adjustments this admission:   Microbiology results: 5/11 BCx:  5/12 MRSA PCR: negative  Thank you for allowing pharmacy to be a part of this patient's care. Lynann Beaver PharmD, BCPS Pager [phone] 12/27/2017 11:33 AM

## 2017-12-27 NOTE — Progress Notes (Signed)
ANTICOAGULATION CONSULT NOTE - Initial Consult  Pharmacy Consult for Lovenox Indication: pulmonary embolus rule out  Allergies  Allergen Reactions  . Sulfa Antibiotics     Patient Measurements: Height:  (185.4 cm) Weight: (!) 391 lb (177.4 kg) IBW/kg (Calculated) : 79.9  Vital Signs: Temp: 98.2 F (36.8 C) (05/12 1200) Temp Source: Oral (05/12 1200) BP: 114/61 (05/12 1100) Pulse Rate: 80 (05/12 1100)  Labs: Recent Labs    12/26/17 1622 12/26/17 2217 12/27/17 0340 12/27/17 1030  HGB 12.1*  --  10.4*  --   HCT 37.8*  --  32.7*  --   PLT 282  --  229  --   CREATININE 1.46*  --  1.20 1.09  TROPONINI  --  0.07*  --  0.04*    Estimated Creatinine Clearance: 107.6 mL/min (by C-G formula based on SCr of 1.09 mg/dL).   Medical History: Past Medical History:  Diagnosis Date  . Diabetes mellitus without complication (HCC)   . Hyperlipidemia   . Hypertension     Medications:  Scheduled:  . aspirin EC  81 mg Oral Daily  . enoxaparin (LOVENOX) injection  80 mg Subcutaneous QHS  . insulin aspart  0-5 Units Subcutaneous QHS  . insulin aspart  0-9 Units Subcutaneous TID WC  . potassium chloride  40 mEq Oral BID  . rosuvastatin  40 mg Oral Daily   Infusions:  . sodium chloride 75 mL/hr at 12/26/17 2153  . piperacillin-tazobactam (ZOSYN)  IV Stopped (12/27/17 0850)    Assessment: 67 yo male presented to ER after fall. To change Lovenox to full dose while ruling out PE. Baseline labs done. No reported bleeding.   Goal of Therapy:  Heparin level 0.6-1.2 units/ml Monitor platelets by anticoagulation protocol: Yes   Plan:  1) Lovenox /kg SQ q12 2) Monitor for s/s bleeding, renal function and CBC   Hessie Knows, PharmD, BCPS Pager [phone] 12/27/2017 3:21 PM

## 2017-12-28 ENCOUNTER — Other Ambulatory Visit: Payer: Self-pay

## 2017-12-28 LAB — COMPREHENSIVE METABOLIC PANEL
ALBUMIN: 2.9 g/dL — AB (ref 3.5–5.0)
ALK PHOS: 55 U/L (ref 38–126)
ALT: 43 U/L (ref 17–63)
AST: 93 U/L — AB (ref 15–41)
Anion gap: 11 (ref 5–15)
BILIRUBIN TOTAL: 0.5 mg/dL (ref 0.3–1.2)
BUN: 26 mg/dL — ABNORMAL HIGH (ref 6–20)
CALCIUM: 8.3 mg/dL — AB (ref 8.9–10.3)
CO2: 21 mmol/L — AB (ref 22–32)
CREATININE: 1.11 mg/dL (ref 0.61–1.24)
Chloride: 108 mmol/L (ref 101–111)
GFR calc Af Amer: >60 mL/min (ref >60)
GFR calc non Af Amer: >60 mL/min (ref >60)
GLUCOSE: 211 mg/dL — AB (ref 65–99)
Potassium: 4.4 mmol/L (ref 3.5–5.1)
SODIUM: 140 mmol/L (ref 135–145)
Total Protein: 6.2 g/dL — ABNORMAL LOW (ref 6.5–8.1)

## 2017-12-28 LAB — LIPID PANEL
Cholesterol: 103 mg/dL (ref 0–200)
HDL: 32 mg/dL — AB (ref >40)
LDL CALC: 42 mg/dL (ref 0–99)
Total CHOL/HDL Ratio: 3.2 RATIO
Triglycerides: 146 mg/dL (ref <150)
VLDL: 29 mg/dL (ref 0–40)

## 2017-12-28 LAB — GLUCOSE, CAPILLARY
GLUCOSE-CAPILLARY: 182 mg/dL — AB (ref 65–99)
GLUCOSE-CAPILLARY: 237 mg/dL — AB (ref 65–99)
GLUCOSE-CAPILLARY: 192 mg/dL — AB (ref 65–99)
Glucose-Capillary: 198 mg/dL — ABNORMAL HIGH (ref 65–99)

## 2017-12-28 LAB — CBC WITH DIFFERENTIAL/PLATELET
BASOS PCT: 0 %
Basophils Absolute: 0 10*3/uL (ref 0.0–0.1)
EOS ABS: 0.1 10*3/uL (ref 0.0–0.7)
Eosinophils Relative: 1 %
HEMATOCRIT: 33.9 % — AB (ref 39.0–52.0)
HEMOGLOBIN: 10.7 g/dL — AB (ref 13.0–17.0)
Lymphocytes Relative: 12 %
Lymphs Abs: 1.4 10*3/uL (ref 0.7–4.0)
MCH: 30.4 pg (ref 26.0–34.0)
MCHC: 31.6 g/dL (ref 30.0–36.0)
MCV: 96.3 fL (ref 78.0–100.0)
Monocytes Absolute: 1.1 10*3/uL — ABNORMAL HIGH (ref 0.1–1.0)
Monocytes Relative: 9 %
NEUTROS ABS: 8.9 10*3/uL — AB (ref 1.7–7.7)
NEUTROS PCT: 78 %
Platelets: 270 10*3/uL (ref 150–400)
RBC: 3.52 MIL/uL — AB (ref 4.22–5.81)
RDW: 15.2 % (ref 11.5–15.5)
WBC: 11.5 10*3/uL — AB (ref 4.0–10.5)

## 2017-12-28 LAB — HEMOGLOBIN A1C
Hgb A1c MFr Bld: 7.9 % — ABNORMAL HIGH (ref 4.8–5.6)
Mean Plasma Glucose: 180.03 mg/dL

## 2017-12-28 LAB — TSH: TSH: 2.58 u[IU]/mL (ref 0.350–4.500)

## 2017-12-28 LAB — PHOSPHORUS: Phosphorus: 3 mg/dL (ref 2.5–4.6)

## 2017-12-28 LAB — URINE CULTURE: CULTURE: NO GROWTH

## 2017-12-28 LAB — MAGNESIUM: Magnesium: 2 mg/dL (ref 1.7–2.4)

## 2017-12-28 MED ORDER — SODIUM CHLORIDE 0.9 % IV SOLN
INTRAVENOUS | Status: AC
  Administered 2017-12-28 – 2017-12-29 (×2): via INTRAVENOUS

## 2017-12-28 MED ORDER — ASPIRIN EC 325 MG PO TBEC
325.0000 mg | DELAYED_RELEASE_TABLET | Freq: Every day | ORAL | Status: DC
  Administered 2017-12-28 – 2017-12-29 (×2): 325 mg via ORAL
  Filled 2017-12-28 (×2): qty 1

## 2017-12-28 MED ORDER — ENOXAPARIN SODIUM 80 MG/0.8ML ~~LOC~~ SOLN
80.0000 mg | SUBCUTANEOUS | Status: DC
  Administered 2017-12-29: 80 mg via SUBCUTANEOUS
  Filled 2017-12-28: qty 0.8

## 2017-12-28 NOTE — Evaluation (Addendum)
Occupational Therapy Evaluation Patient Details Name: JERMAINE NEUHARTH MRN: [id number] DOB: 09/16/1950 Today's Date: 12/28/2017    History of Present Illness DIOGO ANNE is a 67 y.o. male with a history of hypertension and diabetes who presents to the emergency department 12/26/17 after near syncopal episode, hypotension and tachycardia, ? early sepsis, , several falls  over past several weeks.   Clinical Impression   Pt admitted with hypotension. Pt currently with functional limitations due to the deficits listed below (see OT Problem List).  Pt will benefit from skilled OT to increase their safety and independence with ADL and functional mobility for ADL to facilitate discharge to venue listed below.      Follow Up Recommendations  Home health OT or No follow up   Equipment Recommendations  None recommended by OT    Recommendations for Other Services       Precautions / Restrictions Precautions Precautions: Fall Precaution Comments: impulsive      Mobility Bed Mobility Overal bed mobility: Needs Assistance Bed Mobility: Sit to Supine;Supine to Sit     Supine to sit: Mod assist Sit to supine: Mod assist   General bed mobility comments: assist legs onto bed  Transfers Overall transfer level: Needs assistance Equipment used: None Transfers: Sit to/from Stand Sit to Stand: Supervision         General transfer comment: patient standing uop to use urinal whrn therapist entered room    Balance Overall balance assessment: History of Falls;Needs assistance Sitting-balance support: Feet supported;No upper extremity supported Sitting balance-Leahy Scale: Good     Standing balance support: During functional activity;No upper extremity supported Standing balance-Leahy Scale: Good                             ADL either performed or assessed with clinical judgement   ADL Overall ADL's : Needs assistance/impaired Eating/Feeding: Set up;Sitting   Grooming:  Set up;Sitting   Upper Body Bathing: Minimal assistance;Sitting                             General ADL Comments: Pt had just gotten back to bed. Reports he was I with ADL activity prior to this admission and did not need AE or assist.  Pt does feel like he will need some A at this time.  ADL activity simulated for OT eval     Vision Patient Visual Report: No change from baseline              Pertinent Vitals/Pain Pain Assessment: No/denies pain     Hand Dominance Left   Extremity/Trunk Assessment Upper Extremity Assessment Upper Extremity Assessment: Generalized weakness   Lower Extremity Assessment Lower Extremity Assessment: Generalized weakness   Cervical / Trunk Assessment Cervical / Trunk Assessment: Normal   Communication Communication Communication: No difficulties   Cognition Arousal/Alertness: Awake/alert Behavior During Therapy: WFL for tasks assessed/performed;Impulsive Overall Cognitive Status: Within Functional Limits for tasks assessed                                                Home Living Family/patient expects to be discharged to:: Private residence Living Arrangements: Spouse/significant other Available Help at Discharge: Family Type of Home: House Home Access: Stairs to enter [employer] of Steps: 3 Entrance  Stairs-Rails: Right;Left Home Layout: One level     Bathroom Shower/Tub: Chief Strategy Officer: Standard     Home Equipment: Cane - quad          Prior Functioning/Environment Level of Independence: Independent with assistive device(s)                 OT Problem List: Decreased strength;Decreased activity tolerance;Impaired balance (sitting and/or standing);Obesity;Pain;Decreased knowledge of use of DME or AE      OT Treatment/Interventions: Self-care/ADL training;Patient/family education;DME and/or AE instruction;Therapeutic activities    OT Goals(Current goals  can be found in the care plan section) Acute Rehab OT Goals Patient Stated Goal: go home OT Goal Formulation: With patient Time For Goal Achievement: 01/11/18 Potential to Achieve Goals: Good ADL Goals Pt Will Perform Grooming: with supervision;standing Pt Will Perform Lower Body Bathing: with supervision;sit to/from stand Pt Will Perform Lower Body Dressing: with supervision;sit to/from stand;sitting/lateral leans Pt Will Transfer to Toilet: with supervision;ambulating;bedside commode Pt Will Perform Toileting - Clothing Manipulation and hygiene: with supervision;sitting/lateral leans;sit to/from stand Pt Will Perform Tub/Shower Transfer: with supervision;Tub transfer  OT Frequency: Min 2X/week   Barriers to D/C:               AM-PAC PT "6 Clicks" Daily Activity     Outcome Measure Help from another person eating meals?: None Help from another person taking care of personal grooming?: A Little Help from another person toileting, which includes using toliet, bedpan, or urinal?: A Little Help from another person bathing (including washing, rinsing, drying)?: A Lot Help from another person to put on and taking off regular upper body clothing?: A Little Help from another person to put on and taking off regular lower body clothing?: A Lot 6 Click Score: 17   End of Session    Activity Tolerance: Patient limited by fatigue Patient left: in bed;with call bell/phone within reach  OT Visit Diagnosis: Unsteadiness on feet (R26.81);Muscle weakness (generalized) (M62.81);History of falling (Z91.81)                Time: 1610-9604 OT Time Calculation (min): 11 min Charges:  OT General Charges $OT Visit: 1 Visit OT Evaluation $OT Eval Moderate Complexity: 1 Mod G-Codes:     Lise Auer, [place] [phone]  Einar Crow D 12/28/2017, 1:38 PM

## 2017-12-28 NOTE — Evaluation (Signed)
Physical Therapy Evaluation Patient Details Name: Martin Leach MRN: [id number] DOB: 23-Apr-1951 Today's Date: 12/28/2017   History of Present Illness  Martin Leach is a 67 y.o. male with a history of hypertension and diabetes who presents to the emergency department 12/26/17 after near syncopal episode, hypotension and tachycardia, ? early sepsis, , several falls  over past several weeks.  Clinical Impression  The patient ambulated in room with min guard, No AD. Patient declined to ambulate farther. Patient should progress to level of independence to return home. Pt admitted with above diagnosis. Pt currently with functional limitations due to the deficits listed below (see PT Problem List). Pt will benefit from skilled PT to increase their independence and safety with mobility to allow discharge to the venue listed below.     Follow Up Recommendations No PT follow up    Equipment Recommendations  (TBD if needed)    Recommendations for Other Services       Precautions / Restrictions Precautions Precautions: Fall Precaution Comments: impulsive Restrictions Weight Bearing Restrictions: No      Mobility  Bed Mobility Overal bed mobility: Needs Assistance Bed Mobility: Sit to Supine       Sit to supine: Mod assist   General bed mobility comments: assist legs onto bed  Transfers Overall transfer level: Needs assistance Equipment used: None Transfers: Sit to/from Stand Sit to Stand: Supervision         General transfer comment: patient standing uop to use urinal whrn therapist entered room  Ambulation/Gait Ambulation/Gait assistance: Min guard Ambulation Distance (Feet): 10 Feet Assistive device: None Gait Pattern/deviations: Step-to pattern;Step-through pattern;Wide base of support     General Gait Details: lateral weight shift when ambulating. On RA. cues for safety, impulsive  Stairs            Wheelchair Mobility    Modified Rankin (Stroke Patients  Only)       Balance Overall balance assessment: History of Falls;Needs assistance Sitting-balance support: Feet supported;No upper extremity supported Sitting balance-Leahy Scale: Good     Standing balance support: During functional activity;No upper extremity supported Standing balance-Leahy Scale: Good                               Pertinent Vitals/Pain Pain Assessment: No/denies pain    Home Living Family/patient expects to be discharged to:: Private residence Living Arrangements: Spouse/significant other Available Help at Discharge: Family Type of Home: House Home Access: Stairs to enter Entrance Stairs-Rails: Doctor, general practice of Steps: 3 Home Layout: One level Home Equipment: Cane - quad      Prior Function Level of Independence: Independent with assistive device(s)               Hand Dominance   Dominant Hand: Left    Extremity/Trunk Assessment        Lower Extremity Assessment Lower Extremity Assessment: Generalized weakness    Cervical / Trunk Assessment Cervical / Trunk Assessment: Normal  Communication   Communication: No difficulties  Cognition Arousal/Alertness: Awake/alert Behavior During Therapy: WFL for tasks assessed/performed;Impulsive Overall Cognitive Status: Within Functional Limits for tasks assessed                                        General Comments      Exercises     Assessment/Plan    PT  Assessment Patient needs continued PT services  PT Problem List Decreased strength;Decreased activity tolerance;Decreased mobility;Decreased safety awareness       PT Treatment Interventions DME instruction;Gait training;Functional mobility training;Therapeutic activities;Patient/family education    PT Goals (Current goals can be found in the Care Plan section)  Acute Rehab PT Goals Patient Stated Goal: go home PT Goal Formulation: With patient Time For Goal Achievement:  01/11/18 Potential to Achieve Goals: Good    Frequency Min 2X/week   Barriers to discharge        Co-evaluation               AM-PAC PT "6 Clicks" Daily Activity  Outcome Measure Difficulty turning over in bed (including adjusting bedclothes, sheets and blankets)?: A Lot Difficulty moving from lying on back to sitting on the side of the bed? : A Lot Difficulty sitting down on and standing up from a chair with arms (e.g., wheelchair, bedside commode, etc,.)?: A Little Help needed moving to and from a bed to chair (including a wheelchair)?: A Little Help needed walking in hospital room?: A Little Help needed climbing 3-5 steps with a railing? : Total 6 Click Score: 14    End of Session   Activity Tolerance: Patient tolerated treatment well Patient left: in bed;with call bell/phone within reach;with bed alarm set Nurse Communication: Mobility status PT Visit Diagnosis: Unsteadiness on feet (R26.81)    Time: 9604-5409 PT Time Calculation (min) (ACUTE ONLY): 15 min   Charges:   PT Evaluation $PT Eval Low Complexity: 1 Low     PT G CodesBlanchard Kelch PT [phone]  Rada Hay 12/28/2017, 11:14 AM

## 2017-12-28 NOTE — Progress Notes (Signed)
PROGRESS NOTE    Martin Leach  WUJ:[id number] DOB: 1951-05-05 DOA: 12/26/2017 PCP: Martha Clan, MD   Brief Narrative:  Martin Leach  is a morbidly obese 67 y.o. male, w hypertension, hyperlipidemia, DM2, and other comorbids who presented with chills and fall while getting out of bed. Pt presented due to fall.  He scraped his legs. Pt admitted for chills/ shaking and hypotension and renal insufficiency/ dehydration with Sepsis 2/2 to suspected UTI. Di-Dimer incidentally noted to be elevate CTA done and unlikely PE so will stop Full Dose Anticoagulation. Patient also has a acute kidney injury which is improved.  Assessment & Plan:   Active Problems:   Hypotension   Lactic acidosis   Fall at home, initial encounter   Tachycardia   Diabetes mellitus type 2 in obese [facility])   Essential hypertension   Hyperlipidemia associated with type 2 diabetes mellitus (HCC)   Elevated d-dimer   Morbid obesity with BMI of 50.0-59.9, adult (HCC)   Elevated troponin   Hypomagnesemia   Sepsis secondary to UTI [facility])  Recent Fall -Unclear Etiology  -PT/OT Evaluated and Treat and PT recommends no follow up and OT recommends Home Health OT  Sepsis likely 2/2 to Suspected UTI -LA was 3.84 and trended down to 1.2 -Blood Culture x2 showed No Growth to Date at 1 Day  -Sepsis physiology is improving -CXR showed Cardiomegaly and mild pulmonary vascular congestion noted. There is no evidence of focal airspace disease, pulmonary edema, suspicious pulmonary nodule/mass, pleural effusion, or pneumothorax. No acute bony abnormalities are identified. -Urinalysis was cloudy, showed 50 glucose, large hemoglobin, negative ketones, large leukocytes, negative nitrites, many bacteria, and greater than 50 WBCs -Unfortunately urine culture was never obtained and have ordered one again; Urine Cx showed No Growth -Started on Empiric IV Vancomycin and IV Zosyn however MRSA PCR was negative so we will stop IV Vancomycin -WBC went  from 15.0 -> 16.9 -> 11.5 -Continue with IV Zosyn for now and likely de-escalate Abx in AM  -Continue with IV fluid hydration with normal saline at a rate of 75 mL/hr  CAD -Seen on patient CTA of the chest: Per report there was significant coronary artery calcification -Increase aspirin 81mg  to 325 mg p.o. daily -We will need outpatient Cardiac follow-up  Hypotension, likely from Infection  -C/w Telemetry  -Plasma Cortisol was 12.9 this morning -Trop I q6h x3; Initial was 0.07 and trending down to -> 0.04 -Cardiac ECHOCardiogram as below -Given 3 Liters IVF Boluses and started on Maintenance at 75 mL/hr -BP improved now 150/79  Tachycardia, improved  -TSH was 2.580 -Checked D Dimer and was 2.79 -V/Q Scan Unable to be done due to patient's body habitus  -Now that Cr is improving will get CTA of Chest to r/o PE; CTA showed no PE  -Improved with IVF Rehydration; C/w IVF Hydration as above  AKI -Patient's BUN and creatinine on admission was 29/1.46 -Has now improved to 26/1.11 after IV fluid hydration -Continue to gently hydrate with maintenance fluid at 75 mils per hour -Avoid nephrotoxic medications if possible and will continue to hold home antihypertensives at this time -Repeat CMP in a.m.  DM Type 2 -Stopped Metformin 1000 mg po Daily due to AKI and Lactic Acidosis -Hold Home Pioglitazone 45 mg po Daily for now as well as Home Dulaglutide -C/w Sensitive Novolog SSI AC/HS -CBG's ranging from 182-237 -Checked HbA1c and was 7.9 -Diabetes Education Coordinator recommending adding Novolog   Hypertension but currently BP on softer side  -Hold  Lisinopril/Hydrochlorothiazide 20-12.5 mg Daily due to hypotension, dehydration, and AKI  Hyperlipidemia -C/w Rosuvastatin 40 mg po Daily   Hypokalemia, improved  -She is potassium this morning was 3.4 and is now improved to 4.4 -Replete with p.o. potassium chloride 40 mg BID daily x2 yesterday  -Continue to monitor and replete  as necessary -Repeat CMP in AM  Lactic Acidosis -Setting of Metformin usage and renal insufficiency as well as infectious etiology -Improved.  Lactic acid level went from 3.84 -> 1.3 now -Was given 3 L of normal saline and then placed on maintenance IV fluids with normal saline at 75 mL's per hour -We will not check further lactic acid levels  Elevated D-Dimer -D-dimer on admission was 2.79 -Unfortunately unable to get a VQ scan given patient's morbid obesity and inability to fit in the VQ scan or -Because creatinine has improved since admission we will order a CTA of Chest to r/o PE and continue with IV fluid hydration at 75 mL per hour -CTA of Chest showed The exam is technically adequate to exclude a large in segmental pulmonary emboli. Smaller emboli would be difficult to exclude due to patient body habitus and associated artifact. Mild parenchymal changes of pulmonary edema and RIGHT LOWER lobe atelectasis. Coronary artery calcification. -Patient is not tachycardic, dyspneic or hypoxic so will stop Full Dose Anticoagulation with 1 mg/kg of Lovenox   Morbid Obesity  -BMI on admission was 51.59 -Weight loss counseling given  Elevated Troponin -Likely in the setting of AKI as well as infection -Doubt ACS as troponin went from 0.07 and is now trended down to 0.04 -Currently denies any chest pain -Checked ECHOCardiogram and showed EF of 60-65% with no Regional Wall Motion Abnormalities  -Repeat EKG in the morning and continued to finish trending troponins  Hypomagnesemia -Patient magnesium level was 1.6 and improved to 2.0 -Replete with IV mag sulfate 2 g yesterday  -Continue to monitor and replete as necessary -Repeat Magnesium level in AM  Normocytic Anemia -Patient's hemoglobin/hematocrit went from 10.4/32.7 and is now 10.7/33.9 -Check anemia panel in the a.m. -Continue to monitor for signs and symptoms of bleeding -Patient will need outpatient colonoscopy if he does not have  had one -Repeat CBC in AM.  DVT prophylaxis: Anticoagulated 1 mg/kg of enoxaparin subcu every 12 hours now stopped and started on VTE Prophylactic Dose  Code Status: FULL CODE Family Communication: No family present at bedside  Disposition Plan: Transfer to Telemetry; Home Health OT at D/C  Consultants:   None   Procedures:  ECHOCARDIOGRAM  ------------------------------------------------------------------- Study Conclusions  - Left ventricle: The cavity size was normal. There was mild   concentric hypertrophy. Systolic function was normal. The   estimated ejection fraction was in the range of 60% to 65%. Wall   motion was normal; there were no regional wall motion   abnormalities. Left ventricular diastolic function parameters   were normal. - Atrial septum: No defect or patent foramen ovale was identified.   Antimicrobials:  Anti-infectives (From admission, onward)   Start     Dose/Rate Route Frequency Ordered Stop   12/27/17 2200  vancomycin (VANCOCIN) 1,750 mg in sodium chloride 0.9 % 500 mL IVPB  Status:  Discontinued     1,750 mg 250 mL/hr over 120 Minutes Intravenous Every 24 hours 12/26/17 2109 12/27/17 1134   12/27/17 2200  vancomycin (VANCOCIN) 2,000 mg in sodium chloride 0.9 % 500 mL IVPB  Status:  Discontinued     2,000 mg 250 mL/hr over 120 Minutes  Intravenous Every 24 hours 12/27/17 1134 12/27/17 1515   12/27/17 0400  piperacillin-tazobactam (ZOSYN) IVPB 3.375 g     3.375 g 12.5 mL/hr over 240 Minutes Intravenous Every 8 hours 12/26/17 2109     12/26/17 2200  vancomycin (VANCOCIN) IVPB 1000 mg/200 mL premix     1,000 mg 200 mL/hr over 60 Minutes Intravenous  Once 12/26/17 2109 12/26/17 2314   12/26/17 1730  piperacillin-tazobactam (ZOSYN) IVPB 3.375 g     3.375 g 100 mL/hr over 30 Minutes Intravenous  Once 12/26/17 1729 12/26/17 1910   12/26/17 1730  vancomycin (VANCOCIN) IVPB 1000 mg/200 mL premix     1,000 mg 200 mL/hr over 60 Minutes Intravenous  Once  12/26/17 1729 12/26/17 1910     Subjective: Seen and examined at bedside sitting in chair bedside eating.  No chest pain, shortness breath, nausea, vomiting.  States he feels better and feels like he is improved.  No lightheadedness or dizziness and denies any other complaints or concerns at this time.  Objective: Vitals:   12/28/17 0312 12/28/17 0400 12/28/17 0500 12/28/17 0600  BP:  122/71  127/70  Pulse:  78 77 79  Resp:      Temp: 98.5 F (36.9 C)     TempSrc: Oral     SpO2:  96% 97% 99%  Weight:      Height:        Intake/Output Summary (Last 24 hours) at 12/28/2017 0803 Last data filed at 12/28/2017 4098 Gross per 24 hour  Intake 2980 ml  Output 1200 ml  Net 1780 ml   Filed Weights   12/26/17 1552 12/26/17 1553  Weight: (!) 177.4 kg (391 lb) (!) 177.4 kg (391 lb)   Examination: Physical Exam:  Constitutional: Well-nourished, well-developed morbidly obese Caucasian male who appears in no acute distress and appears calm and comfortable sitting bedside eating breakfast Eyes: Lids and conjunctive are normal.  Sclera are anicteric ENMT: External ears and nose appear normal.  Grossly normal hearing Neck: Supple with no JVD Respiratory: Diminished to auscultation bilaterally with no appreciable wheezing, rales, rhonchi.  Respiratory effort is normal and patient has unlabored breathing and no accessory muscle usage Cardiovascular: Regular rate and rhythm.  No appreciable murmurs, rubs, gallops.  Has 1+ lower cavity edema Abdomen: Soft, nontender, severely distended secondary body habitus.  Bowel sounds present all 4 quadrants GU: Deferred Musculoskeletal: No clubbing or cyanosis, no joint deformities noted Skin: Skin is warm and dry; has some scrapes on the lower extremity Neurologic: No nerves II through XII grossly intact no appreciable focal deficits. Psychiatric: Normal mood and affect.  Intact judgment intact.  Data Reviewed: I have personally reviewed following labs  and imaging studies  CBC: Recent Labs  Lab 12/26/17 1622 12/27/17 0340 12/28/17 0340  WBC 15.0* 16.9* 11.5*  NEUTROABS 13.2*  --  8.9*  HGB 12.1* 10.4* 10.7*  HCT 37.8* 32.7* 33.9*  MCV 97.4 96.5 96.3  PLT 282 229 270   Basic Metabolic Panel: Recent Labs  Lab 12/26/17 1622 12/27/17 0340 12/27/17 1030 12/28/17 0340  NA 141 140 141 140  K 3.7 3.4* 3.6 4.4  CL 105 108 108 108  CO2 22 21* 23 21*  GLUCOSE 297* 187* 187* 211*  BUN 29* 30* 28* 26*  CREATININE 1.46* 1.20 1.09 1.11  CALCIUM 8.9 8.0* 8.2* 8.3*  MG  --   --  1.6* 2.0  PHOS  --   --  2.6 3.0   GFR: Estimated Creatinine Clearance: 105.6  mL/min (by C-G formula based on SCr of 1.11 mg/dL). Liver Function Tests: Recent Labs  Lab 12/26/17 1622 12/27/17 0340 12/28/17 0340  AST 25 65* 93*  ALT 25 31 43  ALKPHOS 66 51 55  BILITOT 0.4 0.4 0.5  PROT 6.6 5.5* 6.2*  ALBUMIN 3.2* 2.7* 2.9*   Recent Labs  Lab 12/26/17 1622  LIPASE 24   No results for input(s): AMMONIA in the last 168 hours. Coagulation Profile: No results for input(s): INR, PROTIME in the last 168 hours. Cardiac Enzymes: Recent Labs  Lab 12/26/17 2217 12/27/17 1030  TROPONINI 0.07* 0.04*   BNP (last 3 results) No results for input(s): PROBNP in the last 8760 hours. HbA1C: No results for input(s): HGBA1C in the last 72 hours. CBG: Recent Labs  Lab 12/27/17 0801 12/27/17 1158 12/27/17 1655 12/27/17 2120 12/28/17 0759  GLUCAP 182* 218* 150* 222* 182*   Lipid Profile: No results for input(s): CHOL, HDL, LDLCALC, TRIG, CHOLHDL, LDLDIRECT in the last 72 hours. Thyroid Function Tests: Recent Labs    12/28/17 0340  TSH 2.580   Anemia Panel: No results for input(s): VITAMINB12, FOLATE, FERRITIN, TIBC, IRON, RETICCTPCT in the last 72 hours. Sepsis Labs: Recent Labs  Lab 12/26/17 1612 12/26/17 2217 12/27/17 0147  LATICACIDVEN 3.84* 2.0* 1.3    Recent Results (from the past 240 hour(s))  MRSA PCR Screening     Status: None     Collection Time: 12/26/17  9:39 PM  Result Value Ref Range Status   MRSA by PCR NEGATIVE NEGATIVE Final    Comment:        The GeneXpert MRSA Assay (FDA approved for NASAL specimens only), is one component of a comprehensive MRSA colonization surveillance program. It is not intended to diagnose MRSA infection nor to guide or monitor treatment for MRSA infections. Performed at [facility], [address] [place], [place] [address]     Radiology Studies: Ct Angio Chest Pe W Or Wo Contrast  Result Date: 12/27/2017 CLINICAL DATA:  Hypertension, hyperlipidemia, DM2, and other comorbidities who presented with chills and fall while getting out of bed. Pt presented due to fall. He scraped his legs. EXAM: CT ANGIOGRAPHY CHEST WITH CONTRAST TECHNIQUE: Multidetector CT imaging of the chest was performed using the standard protocol during bolus administration of intravenous contrast. Multiplanar CT image reconstructions and MIPs were obtained to evaluate the vascular anatomy. CONTRAST:  ISOVUE-370 IOPAMIDOL (ISOVUE-370) INJECTION 76% COMPARISON:  Chest x-ray on 12/26/2017 FINDINGS: Cardiovascular: There is significant coronary artery calcification. Heart size is normal. No pericardial effusion. The pulmonary arteries are well opacified. There is artifact from patient body habitus. However no large or segmental pulmonary emboli are identified. Smaller emboli may be difficult to exclude. There is atherosclerotic calcification of the thoracic aorta not associated with aneurysm. 6 Mediastinum/Nodes: In the RIGHT paratracheal region there is a soft tissue nodule measuring 1.8 x 2.4 centimeters. This is just below the RIGHT lobe of the thyroid gland. Considerations include lymph node or parathyroid adenoma. A subcarinal lymph node is 1.7 centimeters. The esophagus is normal in appearance. No mediastinal or axillary adenopathy. Lungs/Pleura: There is smooth septal thickening  particularly within the dependent aspects of the lungs. No focal consolidations or pleural effusions. No pneumothorax. There is RIGHT LOWER lobe atelectasis. Upper Abdomen: Small lesion in the LEFT hepatic lobe is 8 millimeters and probably benign based on its size and appearance. Gallbladder is present. Musculoskeletal: There is irregularity of the anterior RIGHT ribs consistent with remote fractures.  No acute fractures are identified. There is mild midthoracic spondylosis. No acute vertebral injury. Review of the MIP images confirms the above findings. IMPRESSION: 1. The exam is technically adequate to exclude a large in segmental pulmonary emboli. Smaller emboli would be difficult to exclude due to patient body habitus and associated artifact. 2. Mild parenchymal changes of pulmonary edema and RIGHT LOWER lobe atelectasis. 3. Coronary artery calcification. 4.  Aortic atherosclerosis.  (ICD10-I70.0) 5. 1.8 x 2.4 centimeters soft tissue nodule in the superior mediastinum could be a lymph node or parathyroid adenoma. Correlation with labs is recommended. 6. No acute fracture.  Remote RIGHT anterior rib fractures. Electronically Signed   By: Norva Pavlov M.D.   On: 12/27/2017 16:50   Dg Chest Port 1 View  Result Date: 12/26/2017 CLINICAL DATA:  Tachycardia EXAM: PORTABLE CHEST 1 VIEW COMPARISON:  None. FINDINGS: Cardiomegaly and mild pulmonary vascular congestion noted. There is no evidence of focal airspace disease, pulmonary edema, suspicious pulmonary nodule/mass, pleural effusion, or pneumothorax. No acute bony abnormalities are identified. IMPRESSION: Cardiomegaly with mild pulmonary vascular congestion. Electronically Signed   By: Harmon Pier M.D.   On: 12/26/2017 18:02   Scheduled Meds: . aspirin EC  325 mg Oral Daily  . enoxaparin (LOVENOX) injection  80 mg Subcutaneous Q24H  . insulin aspart  0-5 Units Subcutaneous QHS  . insulin aspart  0-9 Units Subcutaneous TID WC  . rosuvastatin  40 mg  Oral Daily   Continuous Infusions: . sodium chloride    . piperacillin-tazobactam (ZOSYN)  IV 3.375 g (12/28/17 0607)    LOS: 2 days   Merlene Laughter, DO [facility] Pager [phone]  If 7PM-7AM, please contact night-coverage [email or web address] Password [email or web address] 12/28/2017, 8:03 AM

## 2017-12-28 NOTE — Progress Notes (Signed)
Inpatient Diabetes Program Recommendations  AACE/ADA: New Consensus Statement on Inpatient Glycemic Control (2015)  Target Ranges:  Prepandial:   less than 140 mg/dL      Peak postprandial:   less than 180 mg/dL (1-2 hours)      Critically ill patients:  140 - 180 mg/dL   Lab Results  Component Value Date   GLUCAP 182 (H) 12/28/2017   HGBA1C 7.9 (H) 12/28/2017    Review of Glycemic Control  RN contacted this Diabetes Coordinator because pt was refusing his insulin, since he doesn't take it at home.  Post-prandial blood sugars noted to be elevated. May benefit from meal coverage insulin.  Diabetes history: DM2 Outpatient Diabetes medications: Trulicity, metformin 1000 mg QAM, Actos 45 mg QD Current orders for Inpatient glycemic control: Novolog 0-9 units tidwc and hs  Inpatient Diabetes Program Recommendations:     Add Novolog 3 units tidwc for meal coverage insulin if pt eats > 50% meal.  Continue to follow. Will transfer from SD to floor this am.   Thank you. Ailene Ards, RD, LDN, CDE Inpatient Diabetes Coordinator [phone]

## 2017-12-29 LAB — CBC WITH DIFFERENTIAL/PLATELET
BASOS PCT: 0 %
Basophils Absolute: 0 10*3/uL (ref 0.0–0.1)
EOS ABS: 0.2 10*3/uL (ref 0.0–0.7)
Eosinophils Relative: 3 %
HCT: 32.2 % — ABNORMAL LOW (ref 39.0–52.0)
HEMOGLOBIN: 10.3 g/dL — AB (ref 13.0–17.0)
Lymphocytes Relative: 19 %
Lymphs Abs: 1.5 10*3/uL (ref 0.7–4.0)
MCH: 31.2 pg (ref 26.0–34.0)
MCHC: 32 g/dL (ref 30.0–36.0)
MCV: 97.6 fL (ref 78.0–100.0)
MONOS PCT: 10 %
Monocytes Absolute: 0.8 10*3/uL (ref 0.1–1.0)
NEUTROS PCT: 68 %
Neutro Abs: 5.5 10*3/uL (ref 1.7–7.7)
PLATELETS: 244 10*3/uL (ref 150–400)
RBC: 3.3 MIL/uL — ABNORMAL LOW (ref 4.22–5.81)
RDW: 14.9 % (ref 11.5–15.5)
WBC: 8 10*3/uL (ref 4.0–10.5)

## 2017-12-29 LAB — PHOSPHORUS: PHOSPHORUS: 3.3 mg/dL (ref 2.5–4.6)

## 2017-12-29 LAB — COMPREHENSIVE METABOLIC PANEL
ALK PHOS: 49 U/L (ref 38–126)
ALT: 41 U/L (ref 17–63)
AST: 69 U/L — ABNORMAL HIGH (ref 15–41)
Albumin: 2.7 g/dL — ABNORMAL LOW (ref 3.5–5.0)
Anion gap: 9 (ref 5–15)
BUN: 20 mg/dL (ref 6–20)
CHLORIDE: 110 mmol/L (ref 101–111)
CO2: 25 mmol/L (ref 22–32)
CREATININE: 1.03 mg/dL (ref 0.61–1.24)
Calcium: 8.4 mg/dL — ABNORMAL LOW (ref 8.9–10.3)
GFR calc Af Amer: >60 mL/min (ref >60)
GFR calc non Af Amer: >60 mL/min (ref >60)
Glucose, Bld: 161 mg/dL — ABNORMAL HIGH (ref 65–99)
Potassium: 4.1 mmol/L (ref 3.5–5.1)
SODIUM: 144 mmol/L (ref 135–145)
Total Bilirubin: 0.5 mg/dL (ref 0.3–1.2)
Total Protein: 5.7 g/dL — ABNORMAL LOW (ref 6.5–8.1)

## 2017-12-29 LAB — GLUCOSE, CAPILLARY
Glucose-Capillary: 165 mg/dL — ABNORMAL HIGH (ref 65–99)
Glucose-Capillary: 184 mg/dL — ABNORMAL HIGH (ref 65–99)

## 2017-12-29 LAB — MAGNESIUM: Magnesium: 1.8 mg/dL (ref 1.7–2.4)

## 2017-12-29 MED ORDER — AMOXICILLIN-POT CLAVULANATE 875-125 MG PO TABS
1.0000 | ORAL_TABLET | Freq: Two times a day (BID) | ORAL | Status: DC
  Administered 2017-12-29: 1 via ORAL
  Filled 2017-12-29: qty 1

## 2017-12-29 MED ORDER — AMOXICILLIN-POT CLAVULANATE 875-125 MG PO TABS: 1.0000 | ORAL_TABLET | Freq: Two times a day (BID) | ORAL | 0 refills | Status: AC

## 2017-12-29 MED ORDER — ASPIRIN 325 MG PO TBEC: 325.0000 mg | DELAYED_RELEASE_TABLET | Freq: Every day | ORAL | 0 refills | Status: DC

## 2017-12-29 MED ORDER — LIP MEDEX EX OINT
TOPICAL_OINTMENT | CUTANEOUS | Status: DC | PRN
  Filled 2017-12-29: qty 7

## 2017-12-29 NOTE — Care Management Important Message (Signed)
Important Message  Patient Details  Name: FREEMON BINFORD MRN: [id number] Date of Birth: [birth year]   Medicare Important Message Given:  Yes    Caren Macadam 12/29/2017, 11:50 AMImportant Message  Patient Details  Name: DELVONTE BERENSON MRN: [id number] Date of Birth: 02/11/1951   Medicare Important Message Given:  Yes    Caren Macadam 12/29/2017, 11:50 AM

## 2017-12-29 NOTE — Progress Notes (Signed)
D/c instructions reviewed w/ pt and wife. Both verbalize understanidng and all questions answered. Pt awaiting dtr for ride home, states she will be here around 1400.

## 2017-12-29 NOTE — Progress Notes (Signed)
Patient refusing the bed alarm all night. RN kept telling patient to call when he had to go to the bathroom and patient will not. Patient stated if RN places bed alarm on him, he would leave.

## 2017-12-29 NOTE — Care Management Note (Signed)
Case Management Note  Patient Details  Name: THIERNO HUN MRN: [id number] Date of Birth: 03/14/1951  Subjective/Objective: Pt admitted with Hypotension                   Action/Plan: discharging home with no need   Expected Discharge Date:  12/29/17               Expected Discharge Plan:  Home/Self Care  In-House Referral:     Discharge planning Services  CM Consult  Post Acute Care Choice:    Choice offered to:     DME Arranged:    DME Agency:     HH Arranged:    HH Agency:     Status of Service:  Completed, signed off  If discussed at [employer] of Stay Meetings, dates discussed:    Additional CommentsGeni Bers, RN 12/29/2017, 11:42 AM

## 2017-12-29 NOTE — Discharge Summary (Signed)
Physician Discharge Summary  Martin Leach:[id number] DOB: [birth year] DOA: 12/26/2017  PCP: Martin Clan, MD  Admit date: 12/26/2017 Discharge date: 12/29/2017  Admitted From: Home Disposition: Home  Recommendations for Outpatient Follow-up:  1. Follow up with PCP in 1-2 weeks 2. Follow up with Cardiology as an outpatient; Referral Sent to Cardiology Coordinator 3. Have PCP follow up on .8 x 2.4 centimeters soft tissue nodule in the superior mediastinum could be a lymph node or parathyroid adenoma. Was not Hypercalcemic and may need further workup if not improved 4. Will need an outpatient Colonoscopy if never had one  5. Please obtain CMP/CBC, Mag, Phos in one week 6. Please follow up on the following pending results:  Home Health: No Equipment/Devices: None   Discharge Condition: Stable CODE STATUS: FULL CODE Diet recommendation: Heart Healthy Carb Modified Diet  Brief/Interim Summary: [name],w hypertension, hyperlipidemia, DM2, and other comorbids who presented with chills and fall while getting out of bed.Pt presented due to fall. He scraped his legs. Pt admitted for chills/ shaking and hypotension and renal insufficiency/ dehydration with Sepsis 2/2 to suspected UTI. Di-Dimer incidentally noted to be elevate CTA done and unlikely PE so will stop Full Dose Anticoagulation. Patient also has a acute kidney injury which is improved. His Urine Cx showed No Growth but likley was done after he was already on Abx. His IV Zosyn was changed to po Augmentin at D/C. CT of Chest showed Coronary Artery Calcifications so a referral was made to Cardiology for follow up at D/C. He improved from and admission and was deemed medically stable to D/C home and follow up with PCP and Cardiology as an outpatient.   Discharge Diagnoses:  Active Problems:   Hypotension   Lactic acidosis   Fall at home, initial encounter   Tachycardia   Diabetes mellitus type 2 in  obese [name]   Essential hypertension   Hyperlipidemia associated with type 2 diabetes mellitus (HCC)   Elevated d-dimer   Morbid obesity with BMI of 50.0-59.9, adult (HCC)   Elevated troponin   Hypomagnesemia   Sepsis secondary to UTI [facility])  Recent Fall -Unclear Etiology  -PT/OT Evaluated and Treat and PT recommends no follow up and OT recommends Home Health OT or no Follow up -Stable   Sepsis likely 2/2 to Suspected UTI -LA was 3.84 and trended down to 1.2 -Blood Culture x2 showed No Growth to Date at 2 Day  -Sepsis physiology is improving -CXR showed Cardiomegaly and mild pulmonary vascular congestion noted. There is no evidence of focal airspace disease, pulmonary edema, suspicious pulmonary nodule/mass, pleural effusion, or pneumothorax. No acute bony abnormalities are identified. -Urinalysis was cloudy, showed 50 glucose, large hemoglobin, negative ketones, large leukocytes, negative nitrites, many bacteria, and greater than 50 WBCs -Unfortunately urine culture was never obtained and have ordered one again; Urine Cx showed No Growth however likely obtained after already on Abx -Started on Empiric IV Vancomycin and IV Zosyn however MRSA PCR was negative so we will stop IV Vancomycin -WBC went from 15.0 -> 16.9 -> 11.5 -Changed IV Zosyn to po Augmentin At D/C -Continue with IV fluid hydration with normal saline at a rate of 75 mL/hr while hospitalized -Follow up with PCP at D/C   CAD -Seen on patient CTA of the chest: Per report there was significant coronary artery calcification -Increase aspirin 81mg  to 325 mg p.o. daily -We will need outpatient Cardiac follow-up and have made referral to [facility]  Hypotension, likely from Infection,  improved   -C/w Telemetry  -Plasma Cortisol was 12.9 this morning -Trop I q6h x3; Initial was 0.07 and trending down to -> 0.04 -Cardiac ECHOCardiogram as below -Given 3 Liters IVF Boluses and started on Maintenance at 75 mL/hr -BP improved  now 133/67 -Follow up with PCP and Cardiology at D/C  Tachycardia, improved  -TSH was 2.580 -Checked D Dimer and was 2.79 -V/Q Scan Unable to be done due to patient's body habitus  -Now that Cr is improving will get CTA of Chest to r/o PE; CTA showed no PE  -Improved with IVF Rehydration; C/w IVF Hydration as above -Follow up with Cardiology as an outpatient   AKI, improved  -Patient's BUN and creatinine on admission was 29/1.46 -Has now improved to 20/1.03 after IV fluid hydration -Continue to gently hydrate with maintenance fluid at 75 mils per hour -Avoid nephrotoxic medications if possible and will continue to hold home antihypertensives at this time and resume in AM -Repeat CMP as an outpatient   DM Type 2 -Stopped Metformin 1000 mg po Daily due to AKI and Lactic Acidosis but ok to resume at D/C as patient is improved -Held Home Pioglitazone 45 mg po Daily for now as well as Home Dulaglutide at D/C -C/w Sensitive Novolog SSI AC/HS while hospitalized  -CBG's ranging from 165-237 -Checked HbA1c and was 7.9 -Diabetes Education Coordinator recommending adding Novolog 3 units TIDwm if Patient eats >50% of Meal  -Follow up with PCP at D/C   Hypertension but currently BP on softer side  -Held Lisinopril/Hydrochlorothiazide 20-12.5 mg Daily due to hypotension, dehydration, and AKI -BP now 133/67 -Resume tomorrow after D/C   Hyperlipidemia -C/w Rosuvastatin 40 mg po Daily   Hypokalemia, improved  -She is potassium this morning was 3.4 and is now improved to 4.1 -Continue to monitor and replete as necessary -Repeat CMP in AM  Lactic Acidosis -Setting of Metformin usage and renal insufficiency as well as infectious etiology -Improved.  Lactic acid level went from 3.84 -> 1.3 now -Was given 3 L of normal saline and then placed on maintenance IV fluids with normal saline at 75 mL's per hour -We will not check further lactic acid levels -Resume Home Metformin in tomorrow    Elevated D-Dimer -D-dimer on admission was 2.79 -Unfortunately unable to get a VQ scan given patient's morbid obesity and inability to fit in the VQ scan or -Because creatinine has improved since admission ordered a CTA of Chest to r/o PE and continue with IV fluid hydration at 75 mL per hour -CTA of Chest showed The exam is technically adequate to exclude a large in segmental pulmonary emboli. Smaller emboli would be difficult to exclude due to patient body habitus and associated artifact. Mild parenchymal changes of pulmonary edema and RIGHT LOWER lobe atelectasis. Coronary artery calcification. -Patient is not tachycardic, dyspneic or hypoxic so will stop Full Dose Anticoagulation with 1 mg/kg of Lovenox  -Follow up with PCP   Morbid Obesity  -BMI on admission was 51.59 -Weight loss counseling given  Elevated Troponin -Likely in the setting of AKI as well as infection -Doubt ACS as troponin went from 0.07 and is now trended down to 0.04 -Currently denies any chest pain -Checked ECHOCardiogram and showed EF of 60-65% with no Regional Wall Motion Abnormalities  -Repeat EKG in the morning and continued to finish trending troponins -Follow up with Cardiology as an outpatient for further evaluation and ischemic workup/testing if they feel necessary   Hypomagnesemia -Patient magnesium level was 1.6  and improved to 1.8 -Continue to monitor and replete as necessary -Repeat Magnesium level in AM  Normocytic Anemia -Patient's hemoglobin/hematocrit stable at 10.3/32.2 -Check anemia panel as an outpatient -Continue to monitor for signs and symptoms of bleeding -Patient will need outpatient colonoscopy if he does not have had one -Repeat CBC as an outpatient   Discharge Instructions  Discharge Instructions    Call MD for:  difficulty breathing, headache or visual disturbances   Complete by:  As directed    Call MD for:  extreme fatigue   Complete by:  As directed    Call MD  for:  hives   Complete by:  As directed    Call MD for:  persistant dizziness or light-headedness   Complete by:  As directed    Call MD for:  persistant nausea and vomiting   Complete by:  As directed    Call MD for:  redness, tenderness, or signs of infection (pain, swelling, redness, odor or green/yellow discharge around incision site)   Complete by:  As directed    Call MD for:  severe uncontrolled pain   Complete by:  As directed    Call MD for:  temperature >100.4   Complete by:  As directed    Diet - low sodium heart healthy   Complete by:  As directed    Discharge instructions   Complete by:  As directed    Follow up with primary care physician as well as cardiology in the outpatient setting.  Take all medications as prescribed.  If symptoms change or worsen please return to emergency room for evaluation.   Increase activity slowly   Complete by:  As directed      Allergies as of 12/29/2017      Reactions   Sulfa Antibiotics       Medication List    TAKE these medications   amoxicillin-clavulanate 875-125 MG tablet Commonly known as:  AUGMENTIN Take 1 tablet by mouth every 12 (twelve) hours for 7 doses.   aspirin 325 MG EC tablet Take 1 tablet (325 mg total) by mouth daily. Start taking on:  12/30/2017 What changed:    medication strength  how much to take   lisinopril-hydrochlorothiazide 20-12.5 MG tablet Commonly known as:  PRINZIDE,ZESTORETIC Take 1 tablet by mouth daily.   metFORMIN 1000 MG tablet Commonly known as:  GLUCOPHAGE Take 1,000 mg by mouth daily with breakfast.   MULTIVITAMIN ADULTS Tabs Take 1 tablet by mouth daily.   pioglitazone 45 MG tablet Commonly known as:  ACTOS Take 45 mg by mouth daily.   rosuvastatin 40 MG tablet Commonly known as:  CRESTOR Take 40 mg by mouth daily.   TRULICITY Louin Inject into the skin.       Allergies  Allergen Reactions  . Sulfa Antibiotics    Consultations:  None  Procedures/Studies: Ct  Angio Chest Pe W Or Wo Contrast  Result Date: 12/27/2017 CLINICAL DATA:  Hypertension, hyperlipidemia, DM2, and other comorbidities who presented with chills and fall while getting out of bed. Pt presented due to fall. He scraped his legs. EXAM: CT ANGIOGRAPHY CHEST WITH CONTRAST TECHNIQUE: Multidetector CT imaging of the chest was performed using the standard protocol during bolus administration of intravenous contrast. Multiplanar CT image reconstructions and MIPs were obtained to evaluate the vascular anatomy. CONTRAST:  ISOVUE-370 IOPAMIDOL (ISOVUE-370) INJECTION 76% COMPARISON:  Chest x-ray on 12/26/2017 FINDINGS: Cardiovascular: There is significant coronary artery calcification. Heart size is normal. No pericardial effusion. The  pulmonary arteries are well opacified. There is artifact from patient body habitus. However no large or segmental pulmonary emboli are identified. Smaller emboli may be difficult to exclude. There is atherosclerotic calcification of the thoracic aorta not associated with aneurysm. 6 Mediastinum/Nodes: In the RIGHT paratracheal region there is a soft tissue nodule measuring 1.8 x 2.4 centimeters. This is just below the RIGHT lobe of the thyroid gland. Considerations include lymph node or parathyroid adenoma. A subcarinal lymph node is 1.7 centimeters. The esophagus is normal in appearance. No mediastinal or axillary adenopathy. Lungs/Pleura: There is smooth septal thickening particularly within the dependent aspects of the lungs. No focal consolidations or pleural effusions. No pneumothorax. There is RIGHT LOWER lobe atelectasis. Upper Abdomen: Small lesion in the LEFT hepatic lobe is 8 millimeters and probably benign based on its size and appearance. Gallbladder is present. Musculoskeletal: There is irregularity of the anterior RIGHT ribs consistent with remote fractures. No acute fractures are identified. There is mild midthoracic spondylosis. No acute vertebral injury.  Review of the MIP images confirms the above findings. IMPRESSION: 1. The exam is technically adequate to exclude a large in segmental pulmonary emboli. Smaller emboli would be difficult to exclude due to patient body habitus and associated artifact. 2. Mild parenchymal changes of pulmonary edema and RIGHT LOWER lobe atelectasis. 3. Coronary artery calcification. 4.  Aortic atherosclerosis.  (ICD10-I70.0) 5. 1.8 x 2.4 centimeters soft tissue nodule in the superior mediastinum could be a lymph node or parathyroid adenoma. Correlation with labs is recommended. 6. No acute fracture.  Remote RIGHT anterior rib fractures. Electronically Signed   By: Norva Pavlov M.D.   On: 12/27/2017 16:50   Dg Chest Port 1 View  Result Date: 12/26/2017 CLINICAL DATA:  Tachycardia EXAM: PORTABLE CHEST 1 VIEW COMPARISON:  None. FINDINGS: Cardiomegaly and mild pulmonary vascular congestion noted. There is no evidence of focal airspace disease, pulmonary edema, suspicious pulmonary nodule/mass, pleural effusion, or pneumothorax. No acute bony abnormalities are identified. IMPRESSION: Cardiomegaly with mild pulmonary vascular congestion. Electronically Signed   By: Harmon Pier M.D.   On: 12/26/2017 18:02    ECHOCARDIOGRAM  ------------------------------------------------------------------- Study Conclusions  - Left ventricle: The cavity size was normal. There was mild concentric hypertrophy. Systolic function was normal. The estimated ejection fraction was in the range of 60% to 65%. Wall motion was normal; there were no regional wall motion abnormalities. Left ventricular diastolic function parameters were normal. - Atrial septum: No defect or patent foramen ovale was identified.  Subjective: Seen and examined at bedside and stated he did not sleep very well. Has no specific complaints and feels well. Denied any CP, SOB, Nausea, Vomiting, lightheadedness or dizziness. Ready to go home  Discharge  Exam: Vitals:   12/29/17 0424 12/29/17 0635  BP: 124/72 133/67  Pulse: 90 84  Resp: 19   Temp: 98.5 F (36.9 C)   SpO2: 95% 95%   Vitals:   12/28/17 2104 12/29/17 0424 12/29/17 0635 12/29/17 0722  BP: 119/66 124/72 133/67   Pulse: 83 90 84   Resp: 20 19    Temp: 98 F (36.7 C) 98.5 F (36.9 C)    TempSrc: Oral Oral    SpO2: 96% 95% 95%   Weight:    (!) 176.9 kg (390 lb)  Height:       General: Pt is alert, awake, not in acute distress Cardiovascular: RRR, S1/S2 +, no rubs, no gallops Respiratory: Diminished bilaterally, no wheezing, no rhonchi Abdominal: Soft, NT, Severely distended due to body  habitus, bowel sounds + Extremities: Trace LE edema, no cyanosis  The results of significant diagnostics from this hospitalization (including imaging, microbiology, ancillary and laboratory) are listed below for reference.    Microbiology: Recent Results (from the past 240 hour(s))  Blood Culture (routine x 2)     Status: None (Preliminary result)   Collection Time: 12/26/17  5:48 PM  Result Value Ref Range Status   Specimen Description   Final    RIGHT ANTECUBITAL Performed at [facility], [address] [place], [place] [address]    Special Requests   Final    BOTTLES DRAWN AEROBIC AND ANAEROBIC Blood Culture adequate volume Performed at [facility], [address] [place], [place] [address]    Culture   Final    NO GROWTH 2 DAYS Performed at [facility], [address] [place], [place] [address]    Report Status PENDING  Incomplete  Blood Culture (routine x 2)     Status: None (Preliminary result)   Collection Time: 12/26/17  5:48 PM  Result Value Ref Range Status   Specimen Description   Final    BLOOD LEFT HAND Performed at [facility], [address], [place], [place] [address]    Special Requests   Final    BOTTLES DRAWN AEROBIC AND ANAEROBIC Blood Culture adequate volume Performed at [facility], [address] [place], [place] [address]    Culture   Final    NO GROWTH 2 DAYS Performed at [facility], [address], [place], [place] [address]    Report Status PENDING  Incomplete  MRSA PCR Screening     Status: None   Collection Time: 12/26/17  9:39 PM  Result Value Ref Range Status   MRSA by PCR NEGATIVE NEGATIVE Final    Comment:        The GeneXpert MRSA Assay (FDA approved for NASAL specimens only), is one component of a comprehensive MRSA colonization surveillance program. It is not intended to diagnose MRSA infection nor to guide or monitor treatment for MRSA infections. Performed at [facility], [address], [place], [place] [address]   Culture, Urine     Status: None   Collection Time: 12/27/17  7:51 AM  Result Value Ref Range Status   Specimen Description   Final    URINE, RANDOM Performed at [facility], [address], [place], [place] [address]    Special Requests   Final    NONE Performed at [facility], [address], [place], [place] [address]    Culture   Final    NO GROWTH Performed at [facility], [address], [place], [place] [address]    Report Status 12/28/2017 FINAL  Final     Labs: BNP (last 3 results) Recent Labs    12/26/17 1622  BNP 88.4   Basic Metabolic Panel: Recent Labs  Lab 12/26/17 1622 12/27/17 0340 12/27/17 1030 12/28/17 0340 12/29/17 0358  NA 141 140 141 140 144  K 3.7 3.4* 3.6 4.4 4.1  CL 105 108 108 108 110  CO2 22 21* 23 21* 25  GLUCOSE 297* 187* 187* 211* 161*  BUN 29* 30* 28* 26* 20  CREATININE 1.46* 1.20 1.09 1.11 1.03  CALCIUM 8.9 8.0* 8.2* 8.3* 8.4*  MG  --   --  1.6* 2.0 1.8  PHOS  --   --  2.6  3.0 3.3   Liver Function Tests: Recent Labs  Lab 12/26/17 1622 12/27/17 0340 12/28/17 0340 12/29/17 0358  AST 25 65* 93* 69*  ALT 25 31 43 41  ALKPHOS 66 51 55 49  BILITOT 0.4 0.4 0.5 0.5   PROT 6.6 5.5* 6.2* 5.7*  ALBUMIN 3.2* 2.7* 2.9* 2.7*   Recent Labs  Lab 12/26/17 1622  LIPASE 24   No results for input(s): AMMONIA in the last 168 hours. CBC: Recent Labs  Lab 12/26/17 1622 12/27/17 0340 12/28/17 0340 12/29/17 0358  WBC 15.0* 16.9* 11.5* 8.0  NEUTROABS 13.2*  --  8.9* 5.5  HGB 12.1* 10.4* 10.7* 10.3*  HCT 37.8* 32.7* 33.9* 32.2*  MCV 97.4 96.5 96.3 97.6  PLT 282 229 270 244   Cardiac Enzymes: Recent Labs  Lab 12/26/17 2217 12/27/17 1030  TROPONINI 0.07* 0.04*   BNP: Invalid input(s): POCBNP CBG: Recent Labs  Lab 12/28/17 0759 12/28/17 1142 12/28/17 1602 12/28/17 2058 12/29/17 0730  GLUCAP 182* 237* 192* 198* 165*   D-Dimer Recent Labs    12/26/17 2217  DDIMER 2.79*   Hgb A1c Recent Labs    12/28/17 0340  HGBA1C 7.9*   Lipid Profile Recent Labs    12/28/17 0340  CHOL 103  HDL 32*  LDLCALC 42  TRIG 914  CHOLHDL 3.2   Thyroid function studies Recent Labs    12/28/17 0340  TSH 2.580   Anemia work up No results for input(s): VITAMINB12, FOLATE, FERRITIN, TIBC, IRON, RETICCTPCT in the last 72 hours. Urinalysis    Component Value Date/Time   COLORURINE YELLOW 12/26/2017 2139   APPEARANCEUR CLOUDY (A) 12/26/2017 2139   LABSPEC 1.019 12/26/2017 2139   PHURINE 5.0 12/26/2017 2139   GLUCOSEU 50 (A) 12/26/2017 2139   HGBUR LARGE (A) 12/26/2017 2139   BILIRUBINUR NEGATIVE 12/26/2017 2139   KETONESUR NEGATIVE 12/26/2017 2139   PROTEINUR 30 (A) 12/26/2017 2139   NITRITE NEGATIVE 12/26/2017 2139   LEUKOCYTESUR LARGE (A) 12/26/2017 2139   Sepsis Labs Invalid input(s): PROCALCITONIN,  WBC,  LACTICIDVEN Microbiology Recent Results (from the past 240 hour(s))  Blood Culture (routine x 2)     Status: None (Preliminary result)   Collection Time: 12/26/17  5:48 PM  Result Value Ref Range Status   Specimen Description   Final    RIGHT ANTECUBITAL Performed at [facility], [address] [place],  [place] [address]    Special Requests   Final    BOTTLES DRAWN AEROBIC AND ANAEROBIC Blood Culture adequate volume Performed at [facility], [address] [place], [place] [address]    Culture   Final    NO GROWTH 2 DAYS Performed at [facility], [address], [place], [place] [address]    Report Status PENDING  Incomplete  Blood Culture (routine x 2)     Status: None (Preliminary result)   Collection Time: 12/26/17  5:48 PM  Result Value Ref Range Status   Specimen Description   Final    BLOOD LEFT HAND Performed at [facility], [address] [place], [place] [address]    Special Requests   Final    BOTTLES DRAWN AEROBIC AND ANAEROBIC Blood Culture adequate volume Performed at [facility], [address], [place], [place] [address]    Culture   Final    NO GROWTH 2 DAYS Performed at [facility], [address] [place], [place] [address]    Report Status  PENDING  Incomplete  MRSA PCR Screening     Status: None   Collection Time: 12/26/17  9:39 PM  Result Value Ref Range Status   MRSA by PCR NEGATIVE NEGATIVE Final    Comment:        The GeneXpert MRSA Assay (FDA approved for NASAL specimens only), is one component of a comprehensive MRSA colonization surveillance program. It is not intended to diagnose MRSA infection nor to guide or monitor treatment for MRSA infections. Performed at [facility], [address] [place], [place] [address]   Culture, Urine     Status: None   Collection Time: 12/27/17  7:51 AM  Result Value Ref Range Status   Specimen Description   Final    URINE, RANDOM Performed at [facility], [address], [place], [place] [address]    Special Requests   Final    NONE Performed at [facility], [address], [place], [place] [address]    Culture   Final    NO GROWTH Performed at [facility], [address], [place], [place] [address]    Report Status 12/28/2017 FINAL  Final   Time coordinating discharge: 35 minutes  SIGNED:  Merlene Laughter, DO [facility] 12/29/2017, 11:23 AM Pager [phone]  If 7PM-7AM, please contact night-coverage [email or web address] Password [email or web address]

## 2017-12-29 NOTE — Plan of Care (Signed)
  Problem: Clinical Measurements: Goal: Will remain free from infection Outcome: Adequate for Discharge   Problem: Activity: Goal: Risk for activity intolerance will decrease Outcome: Adequate for Discharge   

## 2017-12-29 NOTE — Progress Notes (Signed)
Patient went into SVT this morning in the 170's. Once patient sat back down, HR went back to NSR. BP 133/67. On call made aware.

## 2017-12-29 NOTE — Consult Note (Signed)
   [facility] CM Inpatient Consult   12/29/2017  Martin Leach [birth year] [id number]   Patient screened for potential [facility] Care Management services. Went to bedside to speak with Martin Leach prior to hospital discharge.   Discussed [facility] Care Management services. Martin Leach pleasantly declines [facility] Care Management follow up. He denies having any medication, transportation to MD appointments, or community case management needs at this time.   Provided [facility] Care Management brochure with contact information and 24-hr nurse advice line magnet.   Will make inpatient RNCM aware [facility] Care Management was declined.   Martin Noble, MSN-Ed, RN,BSN [facility] Liaison [phone]

## 2018-01-01 DIAGNOSIS — I959 Hypotension, unspecified: Secondary | ICD-10-CM | POA: Diagnosis not present

## 2018-01-01 DIAGNOSIS — Z7689 Persons encountering health services in other specified circumstances: Secondary | ICD-10-CM | POA: Diagnosis not present

## 2018-01-01 DIAGNOSIS — I10 Essential (primary) hypertension: Secondary | ICD-10-CM | POA: Diagnosis not present

## 2018-01-01 DIAGNOSIS — A419 Sepsis, unspecified organism: Secondary | ICD-10-CM | POA: Diagnosis not present

## 2018-01-01 DIAGNOSIS — N179 Acute kidney failure, unspecified: Secondary | ICD-10-CM | POA: Diagnosis not present

## 2018-01-01 DIAGNOSIS — E1129 Type 2 diabetes mellitus with other diabetic kidney complication: Secondary | ICD-10-CM | POA: Diagnosis not present

## 2018-01-01 DIAGNOSIS — I2510 Atherosclerotic heart disease of native coronary artery without angina pectoris: Secondary | ICD-10-CM | POA: Diagnosis not present

## 2018-01-01 DIAGNOSIS — D649 Anemia, unspecified: Secondary | ICD-10-CM | POA: Diagnosis not present

## 2018-01-01 DIAGNOSIS — R000 Tachycardia, unspecified: Secondary | ICD-10-CM | POA: Diagnosis not present

## 2018-01-01 DIAGNOSIS — E7849 Other hyperlipidemia: Secondary | ICD-10-CM | POA: Diagnosis not present

## 2018-01-01 DIAGNOSIS — W19XXXA Unspecified fall, initial encounter: Secondary | ICD-10-CM | POA: Diagnosis not present

## 2018-01-01 DIAGNOSIS — Z6841 Body Mass Index (BMI) 40.0 and over, adult: Secondary | ICD-10-CM | POA: Diagnosis not present

## 2018-01-01 DIAGNOSIS — E876 Hypokalemia: Secondary | ICD-10-CM | POA: Diagnosis not present

## 2018-01-01 LAB — CULTURE, BLOOD (ROUTINE X 2)
Culture: NO GROWTH
Culture: NO GROWTH
SPECIAL REQUESTS: ADEQUATE
Special Requests: ADEQUATE

## 2018-01-14 DIAGNOSIS — I10 Essential (primary) hypertension: Secondary | ICD-10-CM | POA: Diagnosis not present

## 2018-01-14 DIAGNOSIS — I2510 Atherosclerotic heart disease of native coronary artery without angina pectoris: Secondary | ICD-10-CM | POA: Diagnosis not present

## 2018-01-14 DIAGNOSIS — R918 Other nonspecific abnormal finding of lung field: Secondary | ICD-10-CM | POA: Diagnosis not present

## 2018-01-14 DIAGNOSIS — E1129 Type 2 diabetes mellitus with other diabetic kidney complication: Secondary | ICD-10-CM | POA: Diagnosis not present

## 2018-01-14 DIAGNOSIS — Z6841 Body Mass Index (BMI) 40.0 and over, adult: Secondary | ICD-10-CM | POA: Diagnosis not present

## 2018-01-14 DIAGNOSIS — Z7689 Persons encountering health services in other specified circumstances: Secondary | ICD-10-CM | POA: Diagnosis not present

## 2018-01-14 DIAGNOSIS — E785 Hyperlipidemia, unspecified: Secondary | ICD-10-CM | POA: Diagnosis not present

## 2018-01-28 ENCOUNTER — Ambulatory Visit (INDEPENDENT_AMBULATORY_CARE_PROVIDER_SITE_OTHER): Payer: PPO | Admitting: Cardiology

## 2018-01-28 ENCOUNTER — Encounter: Payer: Self-pay | Admitting: Cardiology

## 2018-01-28 VITALS — BP 103/64 | HR 78 | Ht 73.0 in | Wt 376.6 lb

## 2018-01-28 DIAGNOSIS — E1169 Type 2 diabetes mellitus with other specified complication: Secondary | ICD-10-CM

## 2018-01-28 DIAGNOSIS — R748 Abnormal levels of other serum enzymes: Secondary | ICD-10-CM | POA: Diagnosis not present

## 2018-01-28 DIAGNOSIS — Z6841 Body Mass Index (BMI) 40.0 and over, adult: Secondary | ICD-10-CM | POA: Diagnosis not present

## 2018-01-28 DIAGNOSIS — Z01818 Encounter for other preprocedural examination: Secondary | ICD-10-CM | POA: Diagnosis not present

## 2018-01-28 DIAGNOSIS — R7989 Other specified abnormal findings of blood chemistry: Secondary | ICD-10-CM

## 2018-01-28 DIAGNOSIS — I10 Essential (primary) hypertension: Secondary | ICD-10-CM

## 2018-01-28 DIAGNOSIS — I2510 Atherosclerotic heart disease of native coronary artery without angina pectoris: Secondary | ICD-10-CM

## 2018-01-28 DIAGNOSIS — E785 Hyperlipidemia, unspecified: Secondary | ICD-10-CM

## 2018-01-28 DIAGNOSIS — R778 Other specified abnormalities of plasma proteins: Secondary | ICD-10-CM

## 2018-01-28 MED ORDER — METOPROLOL TARTRATE 50 MG PO TABS: 50.0000 mg | ORAL_TABLET | Freq: Once | ORAL | 0 refills | Status: DC

## 2018-01-28 NOTE — Patient Instructions (Addendum)
MEDICATION INSTRUCTIONS   NO CHANGES    Your physician recommends that you schedule a follow-up appointment in 2 TO 3 MONTHS WITH DR Herbie BaltimoreHARDING - F/U TEST   Your physician has requested that you have cardiac CORONARY CTA. Cardiac computed tomography (CT) is a painless test that uses an x-ray machine to take clear, detailed pictures of your heart. For further information please visit [email or web address]. Please follow instruction sheet as given.        Please arrive at the [facility] main entrance of [facility] at xx:xx AM (30-45 minutes prior to test start time)  [facility] [address] [place], [place] [address] [phone]  Proceed to the [facility] Radiology Department (First Floor).  Please follow these instructions carefully (unless otherwise directed):  Hold all erectile dysfunction medications at least 48 hours prior to test.  On the Night Before the Test: . Drink plenty of water. . Do not consume any caffeinated/decaffeinated beverages or chocolate 12 hours prior to your test. . Do not take any antihistamines 12 hours prior to your test. . If you take Metformin do not take 24 hours prior to test.   On the Day of the Test: . Drink plenty of water. Do not drink any water within one hour of the test. . Do not eat any food 4 hours prior to the test. . You may take your regular medications prior to the test. .  Take 50 mg of lopressor (metoprolol) one hour before the test. . HOLD LINSINOPRIL -HCTZ morning of the test.  After the Test: . Drink plenty of water. . After receiving IV contrast, you may experience a mild flushed feeling. This is normal. . On occasion, you may experience a mild rash up to 24 hours after the test. This is not dangerous. If this occurs, you can take Benadryl 25 mg and increase your fluid intake. . If you experience trouble breathing, this can be serious. If it is severe call 911 IMMEDIATELY. If it is mild, please  call our office. . If you take any of these medications: Glipizide/Metformin, Avandament, Glucavance, please do not take 48 hours after completing test.

## 2018-01-28 NOTE — Progress Notes (Signed)
PCP: Martha Clan, MD  Clinic Note: Chief Complaint  Patient presents with  . New Patient (Initial Visit)    coronary artery calcification    HPI: Martin Leach is a 67 y.o. male who presents today for hospital follow-up after presenting with near syncope. He had a chest CT that showed coronary artery calcifications.    Martin Leach has a PMH of HTN & DM-2 as well as morbid obesity.  Recent Hospitalizations:   Hospitalized May 11-14, 2019 sepsis with acute renal insufficiency,  Hypotension -- CT chest showed Coronary Calcification.  Studies Personally Reviewed - (if available, images/films reviewed: From Epic Chart or Care Everywhere)  CT Angio Chest Dec 27, 2017: Coronary artery calcification with aortic atherosclerosis noted.   RLL atelectasis. No PE.  2D Echo Dec 27, 2017: EF 60-65 with mild LVH.  No RWMA.  Normal valves.  Interval History: Martin Leach is a morbidly obese gentleman who says she has metabolic syndrome with diabetes and hypertension and obesity.  He had a near syncopal episode back on May 11.  He went to the ER after basically falling down lightheaded without loss of consciousness.  He was noted to be hypotensive and was treated for sepsis.  Part of his evaluation for PE showed coronary artery calcification. Martin Leach himself really does not do much exercise he has to walk with a cane because of an injury to his left leg and foot.  But he will get short of breath if he walks more than to the mailbox.  But he indicates that this is actually more limited by his leg pain than shortness of breath.  He has not had any chest tightness or pressure with rest or exertion.  He does have edema with apparent venous stasis changes, but no real PND orthopnea.  He sleeps somewhat sitting up mostly because of body habitus.  He says usually his legs are not as swollen, but since the hospital stay that way.   He denies any symptoms of chest tightness or pressure with rest or exertion.  Does have  exertional dyspnea but not with routine exertion.   No PND, orthopnea or edema. No palpitations, lightheadedness, dizziness, weakness or syncope/near syncope. No TIA/amaurosis fugax symptoms. No melena, hematochezia, hematuria, or epstaxis. No claudication.  ROS: A comprehensive was performed. Review of Systems  Constitutional: Positive for malaise/fatigue.  Cardiovascular: Negative for claudication and leg swelling.  Gastrointestinal: Negative for blood in stool and melena.  Genitourinary: Negative for hematuria.  Neurological: Positive for dizziness, weakness and headaches. Negative for loss of consciousness.   I have reviewed and (if needed) personally updated the patient's problem list, medications, allergies, past medical and surgical history, social and family history.   Past Medical History:  Diagnosis Date  . Diabetes mellitus without complication (HCC)   . Hyperlipidemia   . Hypertension     Past Surgical History:  Procedure Laterality Date  . 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 TONSILLECTOMY      Current Meds  Medication Sig  . aspirin EC 81 MG tablet Take 81 mg by mouth daily.  . Dulaglutide (TRULICITY Coleman) Inject into the skin.  Marland Kitchen lisinopril-hydrochlorothiazide (PRINZIDE,ZESTORETIC) 20-12.5 MG tablet Take 1 tablet by mouth daily.   . metFORMIN (GLUCOPHAGE) 1000 MG tablet Take 1,000 mg by mouth daily with breakfast.  . Multiple Vitamins-Minerals (MULTIVITAMIN ADULTS) TABS Take 1 tablet by mouth daily.  . pioglitazone (ACTOS) 45 MG tablet Take 45 mg by mouth daily.   Marland Kitchen  rosuvastatin (CRESTOR) 40 MG tablet Take 20 mg by mouth daily.   . [DISCONTINUED] aspirin EC 325 MG EC tablet Take 1 tablet (325 mg total) by mouth daily.    Allergies  Allergen Reactions  . Codeine   . Sulfa Antibiotics     Social History   Tobacco Use  . Smoking status: Never Smoker  . Smokeless tobacco: Never Used  Substance Use Topics  . Alcohol use: Yes    Comment: occasional  cold beer  . Drug use: Never   Social History   Social History Narrative  . Not on file    family history includes Heart attack in his mother; Stroke in his father and mother.  Wt Readings from Last 3 Encounters:  01/28/18 (!) 376 lb 9.6 oz (170.8 kg)  12/29/17 (!) 390 lb (176.9 kg)    PHYSICAL EXAM BP 103/64   Pulse 78   Ht 6\' 1"  (1.854 m)   Wt (!) 376 lb 9.6 oz (170.8 kg)   BMI 49.69 kg/m  Physical Exam  Constitutional: He is oriented to person, place, and time. He appears well-developed and well-nourished. No distress.  Morbidly obese.  Well-groomed.  Neck: No JVD present. No thyromegaly present.  Cardiovascular: Normal rate, regular rhythm, normal heart sounds and intact distal pulses.  Extrasystoles are present. PMI is not displaced. Exam reveals no gallop and no friction rub.  No murmur heard. Pulmonary/Chest: Breath sounds normal. No respiratory distress. He has no wheezes. He has no rales.  Abdominal: Soft. Bowel sounds are normal. He exhibits no distension. There is no tenderness. There is no rebound.  Musculoskeletal: Normal range of motion. He exhibits edema (R> L 3+ ).  Neurological: He is alert and oriented to person, place, and time. A cranial nerve deficit is present.  Skin: Skin is warm and dry.  Psychiatric: He has a normal mood and affect. His behavior is normal. Judgment and thought content normal.     Adult ECG Report N/a  Other studies Reviewed: Additional studies/ records that were reviewed today include:  Recent Labs:   Lab Results  Component Value Date   CREATININE 1.03 12/29/2017   BUN 20 12/29/2017   NA 144 12/29/2017   K 4.1 12/29/2017   CL 110 12/29/2017   CO2 25 12/29/2017   Lab Results  Component Value Date   CHOL 103 12/28/2017   HDL 32 (L) 12/28/2017   LDLCALC 42 12/28/2017   TRIG 146 12/28/2017   CHOLHDL 3.2 12/28/2017    ASSESSMENT / PLAN: Problem List Items Addressed This Visit    Morbid obesity with BMI of 50.0-59.9,  adult (HCC)    This is probably these reasons why he is short of breath.  He has metabolic syndrome with obesity diabetes hyperlipidemia and hypertension.  This increases his risk overall. Continue to figure out ways to exercise and diet control..      Relevant Orders   Basic metabolic panel   CT CORONARY MORPH W/CTA COR W/SCORE W/CA W/CM &/OR WO/CM   CT CORONARY FRACTIONAL FLOW RESERVE DATA PREP   CT CORONARY FRACTIONAL FLOW RESERVE FLUID ANALYSIS   Hyperlipidemia associated with type 2 diabetes mellitus (HCC)    No labs recently available.  He is on rosuvastatin.  He is also on Actos.  May want to consider using a different agent other than.  Is on.  Consider Jardiance.      Relevant Medications   aspirin EC 81 MG tablet   Essential hypertension  Blood pressure when he is a little low.  This is on ACE inhibitor-HCTZ.      Relevant Medications   aspirin EC 81 MG tablet   Elevated troponin    Elevated troponin in the hospital in the setting of sepsis and hypotension  --> probably related to ischemia however with coronary artery calcification on CT scan will need to evaluate for ischemic CAD: Check coronary CTA along with cardiac calcium score.      Relevant Orders   CT CORONARY MORPH W/CTA COR W/SCORE W/CA W/CM &/OR WO/CM   CT CORONARY FRACTIONAL FLOW RESERVE DATA PREP   CT CORONARY FRACTIONAL FLOW RESERVE FLUID ANALYSIS   Coronary artery calcification seen on CAT scan - Primary (Chronic)    Hard to tell what extent of coronary calcification there is.  With elevated troponins in the setting of hypotension, but this is probably demand ischemia, however need to exclude ischemic CAD. Would like to 28 anatomy better with coronary CT angiogram.      Relevant Medications   aspirin EC 81 MG tablet   Other Relevant Orders   Basic metabolic panel   CT CORONARY MORPH W/CTA COR W/SCORE W/CA W/CM &/OR WO/CM   CT CORONARY FRACTIONAL FLOW RESERVE DATA PREP   CT CORONARY FRACTIONAL FLOW  RESERVE FLUID ANALYSIS    Other Visit Diagnoses    Pre-op testing       Relevant Orders   Basic metabolic panel     Episode of near syncopal symptoms clearly related to hypotension and not a cardiovascular etiology.   I spent a total of 40 minutes with the patient and chart review. >  50% of the time was spent in direct patient consultation.   Current medicines are reviewed at length with the patient today.  (+/- concerns) na/a The following changes have been made:  n/a  Patient Instructions  MEDICATION INSTRUCTIONS   NO CHANGES    Your physician recommends that you schedule a follow-up appointment in 2 TO 3 MONTHS WITH DR Modupe Shampine - F/U TEST   Your physician has requested that you have cardiac CORONARY CTA. Cardiac computed tomography (CT) is a painless test that uses an x-ray machine to take clear, detailed pictures of your heart. For further information please visit [email or web address]. Please follow instruction sheet as given.  Studies Ordered:   Orders Placed This Encounter  Procedures  . CT CORONARY MORPH W/CTA COR W/SCORE W/CA W/CM &/OR WO/CM  . CT CORONARY FRACTIONAL FLOW RESERVE DATA PREP  . CT CORONARY FRACTIONAL FLOW RESERVE FLUID ANALYSIS  . Basic metabolic panel      Bryan Lemmaavid Delaynie Stetzer, M.D., M.S. Interventional Cardiologist   Pager # [phone] Phone # [phone] [address] [place], [place] [address]   Thank you for choosing [facility]!!

## 2018-01-30 ENCOUNTER — Encounter: Payer: Self-pay | Admitting: Cardiology

## 2018-01-30 DIAGNOSIS — I2510 Atherosclerotic heart disease of native coronary artery without angina pectoris: Secondary | ICD-10-CM | POA: Insufficient documentation

## 2018-01-30 NOTE — Assessment & Plan Note (Signed)
No labs recently available.  He is on rosuvastatin.  He is also on Actos.  May want to consider using a different agent other than.  Is on.  Consider Jardiance.

## 2018-01-30 NOTE — Assessment & Plan Note (Signed)
Blood pressure when he is a little low.  This is on ACE inhibitor-HCTZ.

## 2018-01-30 NOTE — Assessment & Plan Note (Signed)
Elevated troponin in the hospital in the setting of sepsis and hypotension  --> probably related to ischemia however with coronary artery calcification on CT scan will need to evaluate for ischemic CAD: Check coronary CTA along with cardiac calcium score.

## 2018-01-30 NOTE — Assessment & Plan Note (Signed)
Hard to tell what extent of coronary calcification there is.  With elevated troponins in the setting of hypotension, but this is probably demand ischemia, however need to exclude ischemic CAD. Would like to 28 anatomy better with coronary CT angiogram.

## 2018-01-30 NOTE — Assessment & Plan Note (Signed)
This is probably these reasons why he is short of breath.  He has metabolic syndrome with obesity diabetes hyperlipidemia and hypertension.  This increases his risk overall. Continue to figure out ways to exercise and diet control..Marland Kitchen

## 2018-03-15 DIAGNOSIS — Z01818 Encounter for other preprocedural examination: Secondary | ICD-10-CM | POA: Diagnosis not present

## 2018-03-15 DIAGNOSIS — Z6841 Body Mass Index (BMI) 40.0 and over, adult: Secondary | ICD-10-CM | POA: Diagnosis not present

## 2018-03-15 DIAGNOSIS — I2510 Atherosclerotic heart disease of native coronary artery without angina pectoris: Secondary | ICD-10-CM | POA: Diagnosis not present

## 2018-03-15 LAB — BASIC METABOLIC PANEL
BUN / CREAT RATIO: 24 (ref 10–24)
BUN: 26 mg/dL (ref 8–27)
CO2: 25 mmol/L (ref 20–29)
CREATININE: 1.1 mg/dL (ref 0.76–1.27)
Calcium: 8.8 mg/dL (ref 8.6–10.2)
Chloride: 103 mmol/L (ref 96–106)
GFR, EST AFRICAN AMERICAN: 79 mL/min/{1.73_m2} (ref >59)
GFR, EST NON AFRICAN AMERICAN: 68 mL/min/{1.73_m2} (ref >59)
Glucose: 132 mg/dL — ABNORMAL HIGH (ref 65–99)
POTASSIUM: 4.3 mmol/L (ref 3.5–5.2)
SODIUM: 144 mmol/L (ref 134–144)

## 2018-03-24 ENCOUNTER — Ambulatory Visit (HOSPITAL_COMMUNITY): Payer: PPO

## 2018-03-24 ENCOUNTER — Encounter (HOSPITAL_COMMUNITY): Payer: Self-pay

## 2018-03-24 ENCOUNTER — Ambulatory Visit (HOSPITAL_COMMUNITY)
Admission: RE | Admit: 2018-03-24 | Discharge: 2018-03-24 | Disposition: A | Discharge status: Home / Self Care | Payer: PPO | Source: Ambulatory Visit | Attending: Cardiology | Admitting: Cardiology

## 2018-03-24 DIAGNOSIS — R590 Localized enlarged lymph nodes: Secondary | ICD-10-CM | POA: Insufficient documentation

## 2018-03-24 DIAGNOSIS — R778 Other specified abnormalities of plasma proteins: Secondary | ICD-10-CM

## 2018-03-24 DIAGNOSIS — R7989 Other specified abnormal findings of blood chemistry: Secondary | ICD-10-CM | POA: Diagnosis not present

## 2018-03-24 DIAGNOSIS — R079 Chest pain, unspecified: Secondary | ICD-10-CM | POA: Diagnosis not present

## 2018-03-24 DIAGNOSIS — J9811 Atelectasis: Secondary | ICD-10-CM | POA: Insufficient documentation

## 2018-03-24 DIAGNOSIS — Z6841 Body Mass Index (BMI) 40.0 and over, adult: Secondary | ICD-10-CM | POA: Insufficient documentation

## 2018-03-24 DIAGNOSIS — I2510 Atherosclerotic heart disease of native coronary artery without angina pectoris: Secondary | ICD-10-CM | POA: Insufficient documentation

## 2018-03-24 MED ORDER — NITROGLYCERIN 0.4 MG SL SUBL
0.8000 mg | SUBLINGUAL_TABLET | SUBLINGUAL | Status: DC | PRN
  Administered 2018-03-24: 0.8 mg via SUBLINGUAL

## 2018-03-24 MED ORDER — METOPROLOL TARTRATE 5 MG/5ML IV SOLN
5.0000 mg | INTRAVENOUS | Status: DC | PRN
  Administered 2018-03-24 (×2): 5 mg via INTRAVENOUS

## 2018-03-24 MED ORDER — IOPAMIDOL (ISOVUE-370) INJECTION 76%
100.0000 mL | Freq: Once | INTRAVENOUS | Status: AC | PRN
  Administered 2018-03-24: 110 mL via INTRAVENOUS

## 2018-03-24 MED ORDER — METOPROLOL TARTRATE 5 MG/5ML IV SOLN
INTRAVENOUS | Status: AC
  Administered 2018-03-24: 5 mg via INTRAVENOUS
  Filled 2018-03-24: qty 10

## 2018-03-24 MED ORDER — NITROGLYCERIN 0.4 MG SL SUBL
SUBLINGUAL_TABLET | SUBLINGUAL | Status: AC
  Administered 2018-03-24: 0.8 mg via SUBLINGUAL
  Filled 2018-03-24: qty 2

## 2018-03-24 NOTE — Progress Notes (Signed)
Tolerated CT without incident. Drank coffee ambulated with cane to exit where wife was waiting.

## 2018-04-12 ENCOUNTER — Other Ambulatory Visit: Payer: Self-pay | Admitting: Pharmacist

## 2018-04-12 NOTE — Patient Outreach (Signed)
[facility]) Care Management  04/12/2018  Martin Leach [birth year] [id number]   Incoming call from Martin Leach in response to the [facility] Medication Adherence Campaign. Speak with patient. HIPAA identifiers verified and verbal consent received. Martin Leach asks that I also speak with his wife, who is also on the line, about his medication as she manages these for him.  Medication Adherence  [name] report that the patient takes metformin 1000 mg once daily and pioglitazone 45 mg once daily. Martin Leach reports that he has not taken his Trulicity in a couple of weeks because he hasn't gotten around to it. Counsel patient on the importance of medication adherence and blood sugar control. Martin Leach reports that he will start taking the Trulicity again today. Counsel patient about the importance of taking this medication every week on the same day to help him to remember. Patient reports that Martin Leach has asked him to check his blood sugar at home, but that he does not do so because he does not want to. Reports that he has not checked in over a year.   Martin Leach reports that the patient is taking his metformin 1000 mg just once daily, rather than twice daily as this is how he prefers to take it. Patient denies any symptoms of low blood sugars. However, Martin Leach reports that the patient has had 4-5 falls in the past year. Reports that patient's most recent A1C was 7.3% in May. Reports that he has his next office visit with his PCP on 05/18/18. Reports that she believes that the patient's falls have been due to his "weak legs" and circulation issues. Reports that he has not had any further falls since they started having the patient sleep further into his bed, away from the edge, and using a walker consistently. Explain [facility] Care Management services and ask if the patient would be interested in having a nurse come to meet with him at home. Denies any interest in nursing  referral.  Medication Assistance  Martin Leach states that Trulicity has been difficult to afford. Note that per the Medicare.gov website, patient currently has partial extra help subsidy. Martin Leach states that they also applied for patient assistance through [facility] for the Trulicity in the past, but did not qualify because they had not met the out of pocket requirement yet. Reports that they have still not met this requirement for this calendar year. Martin Leach states that they are interested in having help with applying for the [place] Patient Assistance Program again this year.   PLAN  1) Will call to follow up with patient's PCP, Martin Leach, to let the provider know that patient and his wife reports that Martin Leach is taking his metformin 1000 mg only once daily, rather than twice daily as prescribed and is not checking his blood sugar at home. Will confirm that provider is aware of the patient's falls.   2) Will ask [place] Technician Martin Leach to reach out to Mr and Mrs. Brigitte Leach to assist the patient and his PCP with completing the patient assistance application for Trulicity through [employer].   [name], PharmD, [place] Management [phone]

## 2018-04-12 NOTE — Patient Outreach (Signed)
[facility] Care Management  04/12/2018  Nelda BucksGary W Gully 1950-12-02 [id number]   Call to follow up with patient's PCP, Dr. Clelia CroftShaw, to let the provider know that patient and his wife report that Mr. Pons is taking his metformin 1000 mg only once daily, rather than twice daily as prescribed and is not checking his blood sugar at home. Will confirm that provider is aware of the patient's falls.   Leave a message with Tresa EndoKelly, assistant with Dr. Clelia CroftShaw, requesting a call back. If have not heard back from the office by 04/14/18, will follow up again at that time.  Duanne MoronElisabeth Shonta Bourque, PharmD, [facility] Clinical Pharmacist [facility] Care Management [phone]

## 2018-04-13 ENCOUNTER — Other Ambulatory Visit: Payer: Self-pay | Admitting: Pharmacy Technician

## 2018-04-13 NOTE — Patient Outreach (Signed)
[facility] Care Management  04/13/2018  Martin Leach [birth year] [id number]   Received [employer] patient assistance referral from [facility] Duanne MoronElisabeth Dhalla for Trulicity. Prepared patient portion to be mailed and faxed provider portion to Dr. Clelia CroftShaw.  Will follow up with patient in 5-7 business days to confirm application has been received.  [name], CPhT [facility] Care Management [phone]

## 2018-04-14 ENCOUNTER — Other Ambulatory Visit: Payer: Self-pay | Admitting: Pharmacist

## 2018-04-14 ENCOUNTER — Other Ambulatory Visit: Payer: Self-pay | Admitting: Pharmacy Technician

## 2018-04-14 NOTE — Patient Outreach (Addendum)
[facility] Care Management  04/14/2018  [name] Jan 13, 1951 [id number]   Speak with Tresa EndoKelly in Dr. Alver FisherShaw's office who states that provider is aware that patient is not checking his blood sugar at home and aware of the patient's falls. States that Dr. Clelia CroftShaw would like the patient to try to take his metformin 1000 mg twice daily as directed. Tresa EndoKelly also states that the provider is appreciative that [facility] is helping with patient assistance for the patient's Trulicity.  Call to follow up with Martin Leach regarding his medication adherence. Patient states that he will try to improve his adherence to taking metformin twice daily as directed by Dr. Clelia CroftShaw.   Martin Leach denies any medication questions/concerns at this time. Let patient know that we will be mailing out the patient assistance program paperwork to him.   PLAN  Will continue to follow with Martin Leach as she assists Martin Leach and his provider with the patient assistance program paperwork.   Martin Leach, PharmD, [facility] Clinical Pharmacist [facility] Care Management [phone]

## 2018-04-14 NOTE — Patient Outreach (Signed)
[facility] Care Management  04/14/2018  [name] November 03, 1950 [id number]   Received request from [facility] Du[facility] MoronElisabeth Dhalla that a co-payment waiver letter be mailed out to patient. Prepared letter to be mailed out.  Will follow up with patient in 5-7 business days to confirm letter was received.  [name], CPhT [facility] Care Management [phone]

## 2018-04-27 ENCOUNTER — Other Ambulatory Visit: Payer: Self-pay | Admitting: Pharmacy Technician

## 2018-04-27 NOTE — Patient Outreach (Signed)
[facility]) Care Management  04/27/2018  Martin Leach 27-Sep-1950 [id number]   Received patient portion of [employer] application for Trulicity. Faxed completed application and required documents into [employer].  Will follow up with company in 7-10 business days to check status of application.  [name] [name] [facility] Care Management [phone]

## 2018-05-03 ENCOUNTER — Encounter: Payer: Self-pay | Admitting: Cardiology

## 2018-05-03 ENCOUNTER — Ambulatory Visit (INDEPENDENT_AMBULATORY_CARE_PROVIDER_SITE_OTHER): Payer: PPO | Admitting: Cardiology

## 2018-05-03 VITALS — BP 106/62 | HR 75 | Ht 73.0 in | Wt 376.6 lb

## 2018-05-03 DIAGNOSIS — E8881 Metabolic syndrome: Secondary | ICD-10-CM

## 2018-05-03 DIAGNOSIS — E785 Hyperlipidemia, unspecified: Secondary | ICD-10-CM | POA: Diagnosis not present

## 2018-05-03 DIAGNOSIS — Z6841 Body Mass Index (BMI) 40.0 and over, adult: Secondary | ICD-10-CM

## 2018-05-03 DIAGNOSIS — I872 Venous insufficiency (chronic) (peripheral): Secondary | ICD-10-CM | POA: Diagnosis not present

## 2018-05-03 DIAGNOSIS — E1169 Type 2 diabetes mellitus with other specified complication: Secondary | ICD-10-CM | POA: Diagnosis not present

## 2018-05-03 DIAGNOSIS — I10 Essential (primary) hypertension: Secondary | ICD-10-CM

## 2018-05-03 DIAGNOSIS — I2510 Atherosclerotic heart disease of native coronary artery without angina pectoris: Secondary | ICD-10-CM

## 2018-05-03 NOTE — Patient Instructions (Signed)
Dr Herbie BaltimoreHarding recommends that you schedule a follow-up appointment in 12 months. You will receive a reminder letter in the mail two months in advance. If you don't receive a letter, please call our office to schedule the follow-up appointment.  If you need a refill on your cardiac medications before your next appointment, please call your pharmacy.   Your physician recommends that you use support stockings and elevate your feet.

## 2018-05-03 NOTE — Progress Notes (Signed)
PCP: Martha Clan, MD  Clinic Note: Chief Complaint  Patient presents with  . Follow-up    f/u test results. pt reports no complaints.    HPI: Martin Leach is a 67 y.o. morbidly obese male with features of metabolic syndrome including diabetes, hypertension and obesity who presents today for follow-up evaluation after initially being seen to discuss a near syncope episode and coronary artery calcification on CT.  Martin Leach was seen for initial consultation on June 16.  This was in response to hospitalization in May of this year where he was found to have sepsis with acute renal insufficiency and hypotension.  He was noted to have a near syncopal event at that time where he got lightheaded and almost fell down. -->  He had already been evaluated and echocardiogram, and his near syncopal episode was clearly related to his illness so I decided to follow-up the coronary calcium with a coronary CTA.  Recent Hospitalizations:   Hospitalized May 11-14, 2019 sepsis with acute renal insufficiency,  Hypotension -- CT chest showed Coronary Calcification.  Studies Personally Reviewed - (if available, images/films reviewed: From Epic Chart or Care Everywhere)  CT Angio Chest Dec 27, 2017: Coronary artery calcification with aortic atherosclerosis noted.   RLL atelectasis. No PE.  2D Echo Dec 27, 2017: EF 60-65%.  with mild LVH.  No RWMA.  Normal valves.  Cor CTA: Coronary artery calcium score 181 Agatston units.  This suggests intermediate risk for future cardiac events.  Nonobstructive coronary disease in LAD and LCx  Interval History: Martin Leach returns today discussed the results of his study.  He thankfully, is doing quite well.  Again he is quite sedentary, having difficulty walking if he does walk and uses a cane due to weight and foot pain.  He definitely gets short of breath walking to the mailbox, but he also feels notable pain.  This has improved though the father where he gets from his  hospitalization.  He has significant bilateral venous stasis changes with edema but no PND orthopnea.  He has morbid obese abdomen and clearly is short of breath just simply bending over or walking because of the obesity.  He has not tried support stockings.  He denies any chest pain or pressure with rest or exertion.  He denies any recurrent near syncopal or syncopal episodes.  No rapid irregular heartbeats or palpitations.  No TIA or amaurosis fugax.  No claudication (in fact he does not walk enough to have claudication)  ROS: A comprehensive was performed. Review of Systems  Constitutional: Positive for malaise/fatigue.  Respiratory: Positive for shortness of breath (At baseline.).   Cardiovascular: Positive for leg swelling (With venous stasis). Negative for claudication.  Gastrointestinal: Negative for blood in stool and melena.  Genitourinary: Negative for hematuria.  Musculoskeletal: Positive for joint pain.  Neurological: Positive for dizziness, weakness and headaches. Negative for loss of consciousness.   I have reviewed and (if needed) personally updated the patient's problem list, medications, allergies, past medical and surgical history, social and family history.   Past Medical History:  Diagnosis Date  . Diabetes mellitus without complication (HCC)   . Hyperlipidemia   . Hypertension     Past Surgical History:  Procedure Laterality Date  . 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 TONSILLECTOMY      Current Meds  Medication Sig  . aspirin EC 81 MG tablet Take 81 mg by mouth daily.  . Dulaglutide (TRULICITY [facility]) Inject into  the skin.  Marland Kitchen. lisinopril-hydrochlorothiazide (PRINZIDE,ZESTORETIC) 20-12.5 MG tablet Take 1 tablet by mouth daily.   . metFORMIN (GLUCOPHAGE) 1000 MG tablet Take 1,000 mg by mouth daily with breakfast.  . Multiple Vitamins-Minerals (MULTIVITAMIN ADULTS) TABS Take 1 tablet by mouth daily.  . pioglitazone (ACTOS) 45 MG tablet Take 45 mg by mouth  daily.   . rosuvastatin (CRESTOR) 40 MG tablet Take 20 mg by mouth daily.     Allergies  Allergen Reactions  . Codeine   . Sulfa Antibiotics     Social History   Tobacco Use  . Smoking status: Never Smoker  . Smokeless tobacco: Never Used  Substance Use Topics  . Alcohol use: Yes    Comment: occasional cold beer  . Drug use: Never   Social History   Social History Narrative  . Not on file    family history includes Heart attack in his mother; Stroke in his father and mother.  Wt Readings from Last 3 Encounters:  05/03/18 (!) 376 lb 9.6 oz (170.8 kg)  01/28/18 (!) 376 lb 9.6 oz (170.8 kg)  12/29/17 (!) 390 lb (176.9 kg)     PHYSICAL EXAM BP 106/62   Pulse 75   Ht 6\' 1"  (1.854 m)   Wt (!) 376 lb 9.6 oz (170.8 kg)   BMI 49.69 kg/m  Physical Exam  Constitutional: He is oriented to person, place, and time. He appears well-developed and well-nourished. No distress.  Super morbidly obese.  Walks with a walker.  HENT:  Head: Normocephalic and atraumatic.  Neck: No JVD present. No thyromegaly present.  Cardiovascular: Normal rate, regular rhythm, normal heart sounds and intact distal pulses.  Extrasystoles are present. PMI is not displaced. Exam reveals no gallop and no friction rub.  No murmur heard. Pulmonary/Chest: Breath sounds normal. No respiratory distress. He has no wheezes. He has no rales.  Abdominal: Bowel sounds are normal.  Obese.  Unable to palpate HSM.  Musculoskeletal: Normal range of motion. He exhibits edema (R> L 3+ -notable tense edema with venous stasis changes.).  Neurological: He is alert and oriented to person, place, and time. A cranial nerve deficit is present.  Skin:  No lesions or wounds despite venous stasis changes.  Psychiatric: He has a normal mood and affect. His behavior is normal. Judgment and thought content normal.     Adult ECG Report N/a  Other studies Reviewed: Additional studies/ records that were reviewed today include:    Recent Labs:   Lab Results  Component Value Date   CREATININE 1.10 03/15/2018   BUN 26 03/15/2018   NA 144 03/15/2018   K 4.3 03/15/2018   CL 103 03/15/2018   CO2 25 03/15/2018   Lab Results  Component Value Date   CHOL 103 12/28/2017   HDL 32 (L) 12/28/2017   LDLCALC 42 12/28/2017   TRIG 146 12/28/2017   CHOLHDL 3.2 12/28/2017    ASSESSMENT / PLAN: Problem List Items Addressed This Visit    Coronary artery calcification seen on CAT scan - Primary (Chronic)    Moderate coronary calcium score which makes him in the intermediate risk.  This goes along with metabolic syndrome and warrants aggressive risk factor modification.  Thankfully however nonobstructive disease noted with mild to moderate disease in the LAD-circumflex.  Plan: Obviously weight loss with diet and exercise.  Maintain glycemic control -consider Jardiance Continue statin, beta-blocker and ACE inhibitor. With moderate disease, would warrant aspirin.      Essential hypertension (Chronic)  Borderline low blood pressure today on low-dose beta-blocker with lisinopril.  Asymptomatic without any orthostatic symptoms.  Continue current meds.      Hyperlipidemia associated with type 2 diabetes mellitus (HCC) (Chronic)    Target LDL really should be less than 70.  He is on rosuvastatin, most recent labs do show that he is well controlled with LDL 42.      Metabolic syndrome (Chronic)   Morbid obesity with BMI of 50.0-59.9, adult (HCC) (Chronic)    Clearly related to shortness of breath.  Metabolic syndrome.  Needs to find some way to get some exercise but also diet.  His wife is also super morbidly obese which makes it complicated.      Venous stasis dermatitis of both lower extremities (Chronic)    Pretty much seems euvolemic, but does have edema in both legs.  I recommended with elevation and support stockings.  He says they are really hard to put on, but I think it is reasonable.  As he seems otherwise  euvolemic from a heart failure standpoint I would not want to put him on a diuretic unless this worsens.  Monitor closely for signs of wounds or lesions.         Current medicines are reviewed at length with the patient today.  (+/- concerns) na/a The following changes have been made:  n/a  Patient Instructions  Dr Herbie Baltimore recommends that you schedule a follow-up appointment in 12 months. You will receive a reminder letter in the mail two months in advance. If you don't receive a letter, please call our office to schedule the follow-up appointment.  If you need a refill on your cardiac medications before your next appointment, please call your pharmacy.   Your physician recommends that you use support stockings and elevate your feet.   Studies Ordered:   No orders of the defined types were placed in this encounter.     Bryan Lemma, M.D., M.S. Interventional Cardiologist   Pager # [phone] Phone # [phone] [address] [place], [place] [address]   Thank you for choosing [facility] at [facility]!!

## 2018-05-05 ENCOUNTER — Encounter: Payer: Self-pay | Admitting: Cardiology

## 2018-05-05 DIAGNOSIS — E8881 Metabolic syndrome: Secondary | ICD-10-CM | POA: Insufficient documentation

## 2018-05-05 DIAGNOSIS — I872 Venous insufficiency (chronic) (peripheral): Secondary | ICD-10-CM | POA: Insufficient documentation

## 2018-05-05 NOTE — Assessment & Plan Note (Signed)
Clearly related to shortness of breath.  Metabolic syndrome.  Needs to find some way to get some exercise but also diet.  His wife is also super morbidly obese which makes it complicated.

## 2018-05-05 NOTE — Assessment & Plan Note (Signed)
Target LDL really should be less than 70.  He is on rosuvastatin, most recent labs do show that he is well controlled with LDL 42.

## 2018-05-05 NOTE — Assessment & Plan Note (Signed)
Borderline low blood pressure today on low-dose beta-blocker with lisinopril.  Asymptomatic without any orthostatic symptoms.  Continue current meds.

## 2018-05-05 NOTE — Assessment & Plan Note (Signed)
Moderate coronary calcium score which makes him in the intermediate risk.  This goes along with metabolic syndrome and warrants aggressive risk factor modification.  Thankfully however nonobstructive disease noted with mild to moderate disease in the LAD-circumflex.  Plan: Obviously weight loss with diet and exercise.  Maintain glycemic control -consider Jardiance Continue statin, beta-blocker and ACE inhibitor. With moderate disease, would warrant aspirin.

## 2018-05-06 NOTE — Assessment & Plan Note (Signed)
Pretty much seems euvolemic, but does have edema in both legs.  I recommended with elevation and support stockings.  He says they are really hard to put on, but I think it is reasonable.  As he seems otherwise euvolemic from a heart failure standpoint I would not want to put him on a diuretic unless this worsens.  Monitor closely for signs of wounds or lesions.

## 2018-05-18 ENCOUNTER — Other Ambulatory Visit: Payer: Self-pay | Admitting: Pharmacy Technician

## 2018-05-18 DIAGNOSIS — E1129 Type 2 diabetes mellitus with other diabetic kidney complication: Secondary | ICD-10-CM | POA: Diagnosis not present

## 2018-05-18 DIAGNOSIS — R82998 Other abnormal findings in urine: Secondary | ICD-10-CM | POA: Diagnosis not present

## 2018-05-18 DIAGNOSIS — E7849 Other hyperlipidemia: Secondary | ICD-10-CM | POA: Diagnosis not present

## 2018-05-18 DIAGNOSIS — Z125 Encounter for screening for malignant neoplasm of prostate: Secondary | ICD-10-CM | POA: Diagnosis not present

## 2018-05-18 DIAGNOSIS — I10 Essential (primary) hypertension: Secondary | ICD-10-CM | POA: Diagnosis not present

## 2018-05-18 NOTE — Patient Outreach (Signed)
[facility]) Care Management  05/18/2018  CINDY BRINDISI [birth year] [id number]   Follow up call to check status of patients application for Trulicity. Gean Maidens confirmed that patient has been approved as of 9/25 until 08/17/2018. She also stated that medication should arrive at providers office in the next 14 business days.  Will follow up with patient in 14-17 business days to confirm medication has been received.  [name] [name] [facility] Care Management [phone]

## 2018-05-25 DIAGNOSIS — I10 Essential (primary) hypertension: Secondary | ICD-10-CM | POA: Diagnosis not present

## 2018-05-25 DIAGNOSIS — E7849 Other hyperlipidemia: Secondary | ICD-10-CM | POA: Diagnosis not present

## 2018-05-25 DIAGNOSIS — E1129 Type 2 diabetes mellitus with other diabetic kidney complication: Secondary | ICD-10-CM | POA: Diagnosis not present

## 2018-05-25 DIAGNOSIS — R918 Other nonspecific abnormal finding of lung field: Secondary | ICD-10-CM | POA: Diagnosis not present

## 2018-05-25 DIAGNOSIS — R600 Localized edema: Secondary | ICD-10-CM | POA: Diagnosis not present

## 2018-05-25 DIAGNOSIS — Z0000 Encounter for general adult medical examination without abnormal findings: Secondary | ICD-10-CM | POA: Diagnosis not present

## 2018-05-25 DIAGNOSIS — Z6841 Body Mass Index (BMI) 40.0 and over, adult: Secondary | ICD-10-CM | POA: Diagnosis not present

## 2018-05-25 DIAGNOSIS — I2510 Atherosclerotic heart disease of native coronary artery without angina pectoris: Secondary | ICD-10-CM | POA: Diagnosis not present

## 2018-05-25 DIAGNOSIS — Z1389 Encounter for screening for other disorder: Secondary | ICD-10-CM | POA: Diagnosis not present

## 2018-05-25 DIAGNOSIS — R945 Abnormal results of liver function studies: Secondary | ICD-10-CM | POA: Diagnosis not present

## 2018-05-25 DIAGNOSIS — Z23 Encounter for immunization: Secondary | ICD-10-CM | POA: Diagnosis not present

## 2018-06-02 ENCOUNTER — Other Ambulatory Visit: Payer: Self-pay | Admitting: Pharmacist

## 2018-06-02 ENCOUNTER — Other Ambulatory Visit: Payer: Self-pay | Admitting: Pharmacy Technician

## 2018-06-02 NOTE — Patient Outreach (Signed)
[facility]) Care Management  06/02/2018  ZAYAN DELVECCHIO 1950-10-04 [id number]   Successful call to patient, HIPAA identifiers verified. Mr. Norville confirmed that he received a 4 month supply of his Trulicity from [employer]. He also states that he has no farther questions and appreciates our help.  Will route note to [facility] RPh Duanne Moron for case closure.  [name] [name] [facility] Care Management [phone]

## 2018-06-02 NOTE — Patient Outreach (Signed)
[facility]) Care Management  06/02/2018  LACHLAN MCKIM [birth year] [id number]   Receive an Shirleen Schirmer message from [facility] Pharmacy Technician Lilla Shook letting me know that Mr. Grill confirmed that he received a 4 month supply of his Trulicity from [employer] and that he has no further questions.   PLAN  1) Will close pharmacy episode and patient to Care Management at this time.  2) Case closure letters sent to patient and PCP.  Duanne Moron, PharmD, [facility] Clinical Pharmacist [facility] Care Management [phone]

## 2018-07-02 DIAGNOSIS — Z1211 Encounter for screening for malignant neoplasm of colon: Secondary | ICD-10-CM | POA: Diagnosis not present

## 2018-07-30 ENCOUNTER — Other Ambulatory Visit: Payer: Self-pay | Admitting: Pharmacist

## 2018-07-30 NOTE — Patient Outreach (Signed)
[facility]) Care Management  07/30/2018  STETSON PELAEZ 12-24-1950 [id number]   Receive a call back from Mr. Huizenga. HIPAA identifiers verified and verbal consent received.  Mr. Wichmann confirms that he is continuing to take Trulicity 1.5 mg subcutaneously once weekly as directed and would like assistance with applying for the [employer] Patient Assistance Program again in 2020. Confirms that Dr. Marton Redwood is his prescriber for this medication.   Patient Assistance Programs: 1. Trulicity made by [employer] ? Income requirement met: X Yes  ? Out-of-pocket prescription expenditure met:     X No ? Note that out-of-pocket requirement can be waved with letter from patient    Plan: I will route patient assistance letter to [facility] technician who will coordinate patient assistance program application process for medications listed above.  [facility] pharmacy technician will assist with obtaining all required documents from both patient and provider and submit application once completed.     [name], PharmD, [place] Management [phone]

## 2018-07-30 NOTE — Addendum Note (Signed)
Addended by: Duanne MoronHALLA, Charlottie Peragine A on: 07/30/2018 04:57 PM   Modules accepted: Orders

## 2018-07-30 NOTE — Patient Outreach (Addendum)
[facility] Care Management  07/30/2018  Martin Leach 06/14/1951 [id number]   Call to follow up with Martin Leach regarding medication assistance for Trulicity. [facility] assisted patient with application for Trulicity from [employer] patient assistance program for the 2019 calendar year. Call to verify whether patient is still taking this medication and, if so, to assist with application for 2020 calendar year. Leave  a HIPAA compliant message on the patient's voicemail.  Duanne Moron.  Dorthey Depace, PharmD, [facility] Clinical Pharmacist [facility] Care Management [phone]

## 2018-08-13 ENCOUNTER — Other Ambulatory Visit: Payer: Self-pay | Admitting: Pharmacy Technician

## 2018-08-13 NOTE — Patient Outreach (Signed)
[facility]) Care Management  08/13/2018  Nelda BucksGary W Blucher 23-Jul-1951 [id number]    Unsuccessful outreach call placed to patient in regards to [employer] patient assistance application for Trulicity.   Unfortunately, patient did not answer the phone to inquire if he had received and mailed back his applications.  Will followup with 2nd outreach call in 3-5 days if call is not returned.  Jazira Maloney P. Jozlyn Schatz, CPhT [facility]ician[facility] Care Management [phone]

## 2018-08-13 NOTE — Patient Outreach (Signed)
[facility]) Care Management  08/13/2018  Martin Leach 08-27-1950 [id number]    ADDENDUM  Incoming call received from patient returning my HIPPA compliant voicemail that was left. HIPPA identifiers verified.  Inquired of patient if he had received his application for his Trulicity through [place] in the mail. Patient states he did receive his application but has not mailed them back yet. He hopes to have them mailed back by the end of the year.  Will followup with patient in 10-14 days if application has not been received back.  Anayansi Rundquist P. Manus Weedman, CPhT MusicianTri[facility] HealthCare Network Care Management [phone]

## 2018-08-19 ENCOUNTER — Other Ambulatory Visit: Payer: Self-pay | Admitting: Pharmacy Technician

## 2018-08-19 NOTE — Patient Outreach (Signed)
[facility]) Care Management  08/19/2018  JARYAN MCKINZIE 1951/04/20 [id number]   Received all necessary documents and signatures from both patient and provider in regards to lilly patient assistance application for trulicity.  Submitted completed application to [employer] via fax.  Will followup in 10-14 days with [employer] to inquire on the status of the application.  Serapio Edelson P. Tarrell Debes, CPhT Musician Care Management [phone]

## 2018-09-01 ENCOUNTER — Other Ambulatory Visit: Payer: Self-pay | Admitting: Pharmacy Technician

## 2018-09-01 NOTE — Patient Outreach (Signed)
[facility]) Care Management  09/01/2018  Nelda BucksGary W Majer [birth year] [id number]   Care coordination call placed to [facility] in regards to Trulicity patient assistance application.  Spoke to [place] who said patient had been APPROVED from 08/26/2018 thru 08/18/2019. Per [facility] patient should be receiving 5 boxes. She said the order was requested on the 12th but not shipped and it could take up to 10 business days from the 12th to arrive at the provider's office.  Will followup with patient in 10-14 business days to confirm receipt of medication.  Karigan Cloninger P. Adante Courington, CPhT MusicianTri[facility] HealthCare Network Care Management [phone]

## 2018-09-10 ENCOUNTER — Other Ambulatory Visit: Payer: Self-pay | Admitting: Pharmacy Technician

## 2018-09-10 NOTE — Patient Outreach (Signed)
[facility]) Care Management  09/10/2018  Martin Leach June 07, 1951 [id number]   Successful outreach call placed to patient in regards to [employer] application for Trulicity.  Spoke to [name], HIPAA identifiers verified. Inquire if [name] had picked up his Trulicity from the providers office and he stated he had received 5 boxes. Informed [name] how to obtain his refills. Patient verbalized understanding. Inquired if [name] had any  other questions or concerns and [name] stated that he did not. I confirmed with [name] that he had our name and number in case any issues were to arise in the future.  Will route note to [facility] Duanne Moron for patient assistance case closure.  [name] P. Aisha Greenberger, CPhT Musician Care Management [phone]

## 2018-09-22 ENCOUNTER — Other Ambulatory Visit: Payer: Self-pay | Admitting: Pharmacist

## 2018-09-22 NOTE — Patient Outreach (Signed)
[facility]) Care Management  09/22/2018  MELVAN ERISMAN Jun 27, 1951 [id number]   Per note in chart from [facility] CPhT Noreene Larsson [place] from 09/10/2018, patient approved for Trulicity patient assistance, picked up medication from provider office and educated by CPhT on how to obtain refills.   Will close pharmacy episode at this time.  Duanne Moron, PharmD, [facility] Clinical Pharmacist [facility] Care Management [phone]

## 2018-09-24 NOTE — Patient Outreach (Signed)
[facility]) Care Management  09/24/2018  Martin Leach 1951-01-07 [id number]   Patient failed statin, ACEi/ARB, and diabetes adherence measures in 2019. Screening patient to determine if any pharmacy intervention would benefit medication adherence for 2020.  Contacted [facility] to discuss recent fill history for patient:  Metformin 1000 mg, 1 tab twice daily, 30 day supply on 09/13/2018 Lisinopril-HCTZ, 2 tab daily, 90 day supply on 07/31/2018 Rosuvastatin 1 daily, 30 day supply on 09/17/2018 Pioglitazone 30 mg, 1 daily, 30 day supply on 09/03/2018.  Per chart review, patient had previously reported taking 1 tablet of metformin daily, 1 tablet of lisinopril-HCTZ daily, and 1/2 rosuvastatin daily. If he is still taking these doses, the prescriptions/day supply transmitted to insurance company will appear that the patient is non adherent (the filled day supplies will actually last longer).   Contacted patient for medication review; was informed that the patient was not available. Left HIPAA compliant message for patient to return my call at his convenience.   Plan - Will attempt outreach #2 in 2-3 business days.   Catie Feliz Beam, PharmD, CPP PGY2 Ambulatory Care Pharmacy Resident, [facility] Phone: [phone]

## 2018-09-28 ENCOUNTER — Other Ambulatory Visit: Payer: Self-pay | Admitting: Pharmacist

## 2018-09-28 NOTE — Patient Outreach (Addendum)
[facility]) Care Management  [place]   09/28/2018  Martin Leach 16-Dec-1950 [id number]  Patient failed statin, ACEi/ARB, and diabetes adherence measures in 2019. Contacted patient to determine if any pharmacy intervention would benefit medication adherence for 2020.  Referral source: [employer]  PMHx includes but not limited to: T2DM, HTN, HLD  During phone call to patient's retail pharmacy last week, it was determined that patient may not be taking lisinopril/HCTZ, rosuvastatin, or metformin as prescribed, per medication review done in 07/2018 by [facility] pharmacist Martin Leach, PharmD.   Contacted patient, HIPAA verifiers identified. Review medications with he and his wife, as he notes she helps manage his medications. He is now taking metformin 1000 mg BID as prescribed, but is only taking lisinopril-HCTZ 1 tablet daily, instead of the 2 tablets daily that his current prescription is written for. He is also taking rosuvastatin every other day, instead of daily as the prescription is written. He is taking pioglitazone 30 mg daily and Trulicity 1.5 mg once weekly as prescribed.    Objective: Lab Results  Component Value Date   CREATININE 1.10 03/15/2018   CREATININE 1.03 12/29/2017   CREATININE 1.11 12/28/2017  eGFR 68 mL/min/1.48m  Lab Results  Component Value Date   HGBA1C 7.9 (H) 12/28/2017    Lipid Panel     Component Value Date/Time   CHOL 103 12/28/2017 0340   TRIG 146 12/28/2017 0340   HDL 32 (L) 12/28/2017 0340   CHOLHDL 3.2 12/28/2017 0340   VLDL 29 12/28/2017 0340   LDLCALC 42 12/28/2017 0340    BP Readings from Last 3 Encounters:  05/03/18 106/62  03/24/18 112/75  01/28/18 103/64    Allergies  Allergen Reactions  . Codeine   . Sulfa Antibiotics     Medications Reviewed Today    [facility] on 09/28/18 at [phone] Med List Status: <None>  Medication Order Taking? Sig  Documenting Provider Last Dose Status Informant  aspirin EC 81 MG tablet [id number] Take 81 mg by mouth daily. [provider] Taking Active   Dulaglutide (TRULICITY) 1.5 MWN/0.2VOSOPN [id number] Inject 1.5 mg into the skin once a week. [provider] Taking Active   lisinopril-hydrochlorothiazide (PRINZIDE,ZESTORETIC) 20-12.5 MG tablet [id number] Take 1 tablet by mouth daily.  [provider] Taking Active Spouse/Significant Other  metFORMIN (GLUCOPHAGE) 1000 MG tablet [id number] Take 1,000 mg by mouth 2 (two) times daily with a meal.  [provider] Taking Active Spouse/Significant Other           Med Note ([place]  Tue Sep 28, 2018  9:31 AM)    Multiple Vitamins-Minerals (MULTIVITAMIN ADULTS) TABS [id number] Take 1 tablet by mouth daily. [provider] Taking Active Spouse/Significant Other  pioglitazone (ACTOS) 30 MG tablet [id number] Take 30 mg by mouth daily.  [provider] Taking Active Spouse/Significant Other  rosuvastatin (CRESTOR) 40 MG tablet [id number] Take 40 mg by mouth every other day.  [provider] Taking Active Spouse/Significant Other          Assessment:  Drugs sorted by system:  Cardiovascular: - Aspirin - Lisinopril-HCTZ 1 tablet daily (rx last filled 07/31/2018 for 2 tablets daily, 90 day supply) - Rosuvastatin 1 tablet every other day (rx last filled 09/17/2018 for 1 tablet daily, 30 day supply)  Endocrine: - Metformin 1000 mg BID (last filled 09/13/2018 for 1 BID, 30 day supply) - Pioglitazone  30 mg daily (last fileld 09/03/2018 for 1 daily, 30 day supply) - Trulicity 1.5 mg once weekly - receiving through patient assistance   Vitamins/Minerals/Supplements: - Multivitamin  Medication Management: - As patient is only taking 1 lisinopril-HCTZ daily and rosuvastatin every other day, it will continue to appear that he is nonadherent with the way the prescriptions  are currently written. Will reach out to PCP Martin Leach to discuss updating the prescriptions to reflect how the patient is taking the medications, or to counsel the patient if he needs to be taking the medications as prescribed. Patient notes he has an appointment with Martin Leach at the end of the month  Plan: - Left message for the nurse covering Martin Leach patients today to return my call. Will reach back out in 3 business days if I have not heard back - Patient has my contact information for any questions or concerns   Martin Leach, PharmD, [place] PGY2 Ambulatory Care Pharmacy Resident, [place] Phone: [phone]

## 2018-09-28 NOTE — Addendum Note (Signed)
Addended by: Lourena Simmonds on: 09/28/2018 09:41 AM   Modules accepted: Orders

## 2018-10-01 ENCOUNTER — Other Ambulatory Visit: Payer: Self-pay | Admitting: Pharmacist

## 2018-10-01 NOTE — Patient Outreach (Signed)
[facility]) Care Management  10/01/2018  JAMIE BELGER Nov 09, 1950 [id number]  Received call back from Dr. Raul Del nurse; they have sent in rosuvastatin and lisinopril-HCTZ appropriately for how the patient is taking them:  - Rosuvastatin 40 mg tablet, take 1 tablet every other day - Lisinopril-HCTZ 20-12.5 mg, take 1 tablet daily  Ensured that the old prescriptions for inaccurate dose/frequencies had all refills cancelled.   Will close case d/t goals being met.   Catie Darnelle Maffucci, PharmD, [place] PGY2 Ambulatory Care Pharmacy Resident, [facility] Phone: [phone]

## 2018-10-13 DIAGNOSIS — D692 Other nonthrombocytopenic purpura: Secondary | ICD-10-CM | POA: Diagnosis not present

## 2018-10-13 DIAGNOSIS — E1129 Type 2 diabetes mellitus with other diabetic kidney complication: Secondary | ICD-10-CM | POA: Diagnosis not present

## 2018-10-13 DIAGNOSIS — E7849 Other hyperlipidemia: Secondary | ICD-10-CM | POA: Diagnosis not present

## 2018-10-13 DIAGNOSIS — Z6841 Body Mass Index (BMI) 40.0 and over, adult: Secondary | ICD-10-CM | POA: Diagnosis not present

## 2018-10-13 DIAGNOSIS — R609 Edema, unspecified: Secondary | ICD-10-CM | POA: Diagnosis not present

## 2018-10-13 DIAGNOSIS — I10 Essential (primary) hypertension: Secondary | ICD-10-CM | POA: Diagnosis not present

## 2019-02-23 DIAGNOSIS — E1129 Type 2 diabetes mellitus with other diabetic kidney complication: Secondary | ICD-10-CM | POA: Diagnosis not present

## 2019-02-23 DIAGNOSIS — I2510 Atherosclerotic heart disease of native coronary artery without angina pectoris: Secondary | ICD-10-CM | POA: Diagnosis not present

## 2019-02-23 DIAGNOSIS — E785 Hyperlipidemia, unspecified: Secondary | ICD-10-CM | POA: Diagnosis not present

## 2019-02-23 DIAGNOSIS — I700 Atherosclerosis of aorta: Secondary | ICD-10-CM | POA: Diagnosis not present

## 2019-02-23 DIAGNOSIS — M545 Low back pain: Secondary | ICD-10-CM | POA: Diagnosis not present

## 2019-02-23 DIAGNOSIS — I10 Essential (primary) hypertension: Secondary | ICD-10-CM | POA: Diagnosis not present

## 2019-03-03 DIAGNOSIS — E1129 Type 2 diabetes mellitus with other diabetic kidney complication: Secondary | ICD-10-CM | POA: Diagnosis not present

## 2019-06-08 DIAGNOSIS — Z23 Encounter for immunization: Secondary | ICD-10-CM | POA: Diagnosis not present

## 2019-06-08 DIAGNOSIS — Z125 Encounter for screening for malignant neoplasm of prostate: Secondary | ICD-10-CM | POA: Diagnosis not present

## 2019-06-08 DIAGNOSIS — E1129 Type 2 diabetes mellitus with other diabetic kidney complication: Secondary | ICD-10-CM | POA: Diagnosis not present

## 2019-06-08 DIAGNOSIS — E7849 Other hyperlipidemia: Secondary | ICD-10-CM | POA: Diagnosis not present

## 2019-06-15 DIAGNOSIS — Z0000 Encounter for general adult medical examination without abnormal findings: Secondary | ICD-10-CM | POA: Diagnosis not present

## 2019-06-15 DIAGNOSIS — Z1331 Encounter for screening for depression: Secondary | ICD-10-CM | POA: Diagnosis not present

## 2019-06-15 DIAGNOSIS — I2510 Atherosclerotic heart disease of native coronary artery without angina pectoris: Secondary | ICD-10-CM | POA: Diagnosis not present

## 2019-06-15 DIAGNOSIS — E1129 Type 2 diabetes mellitus with other diabetic kidney complication: Secondary | ICD-10-CM | POA: Diagnosis not present

## 2019-06-15 DIAGNOSIS — I700 Atherosclerosis of aorta: Secondary | ICD-10-CM | POA: Diagnosis not present

## 2019-06-15 DIAGNOSIS — E785 Hyperlipidemia, unspecified: Secondary | ICD-10-CM | POA: Diagnosis not present

## 2019-06-15 DIAGNOSIS — I10 Essential (primary) hypertension: Secondary | ICD-10-CM | POA: Diagnosis not present

## 2019-06-15 DIAGNOSIS — D649 Anemia, unspecified: Secondary | ICD-10-CM | POA: Diagnosis not present

## 2019-06-21 DIAGNOSIS — R82998 Other abnormal findings in urine: Secondary | ICD-10-CM | POA: Diagnosis not present

## 2019-06-21 DIAGNOSIS — I10 Essential (primary) hypertension: Secondary | ICD-10-CM | POA: Diagnosis not present

## 2019-06-21 NOTE — Patient Outreach (Signed)
Received a referral from Dr. Brigitte Pulse,  Patient has [employer] insurance, This referral has been transferred to [employer] CM through email address [email or web address] per workflow.

## 2019-10-20 DIAGNOSIS — I700 Atherosclerosis of aorta: Secondary | ICD-10-CM | POA: Diagnosis not present

## 2019-10-20 DIAGNOSIS — E1129 Type 2 diabetes mellitus with other diabetic kidney complication: Secondary | ICD-10-CM | POA: Diagnosis not present

## 2019-10-20 DIAGNOSIS — E785 Hyperlipidemia, unspecified: Secondary | ICD-10-CM | POA: Diagnosis not present

## 2019-10-20 DIAGNOSIS — D649 Anemia, unspecified: Secondary | ICD-10-CM | POA: Diagnosis not present

## 2019-10-20 DIAGNOSIS — I10 Essential (primary) hypertension: Secondary | ICD-10-CM | POA: Diagnosis not present

## 2019-10-21 DIAGNOSIS — E1129 Type 2 diabetes mellitus with other diabetic kidney complication: Secondary | ICD-10-CM | POA: Diagnosis not present

## 2019-11-02 DIAGNOSIS — E119 Type 2 diabetes mellitus without complications: Secondary | ICD-10-CM | POA: Diagnosis not present

## 2019-11-13 IMAGING — DX DG CHEST 1V PORT
1 series · 1 of 1 positions shown · non-contrast
Comparison: None.

CLINICAL DATA: Tachycardia

EXAM:
PORTABLE CHEST 1 VIEW

[chest ap]
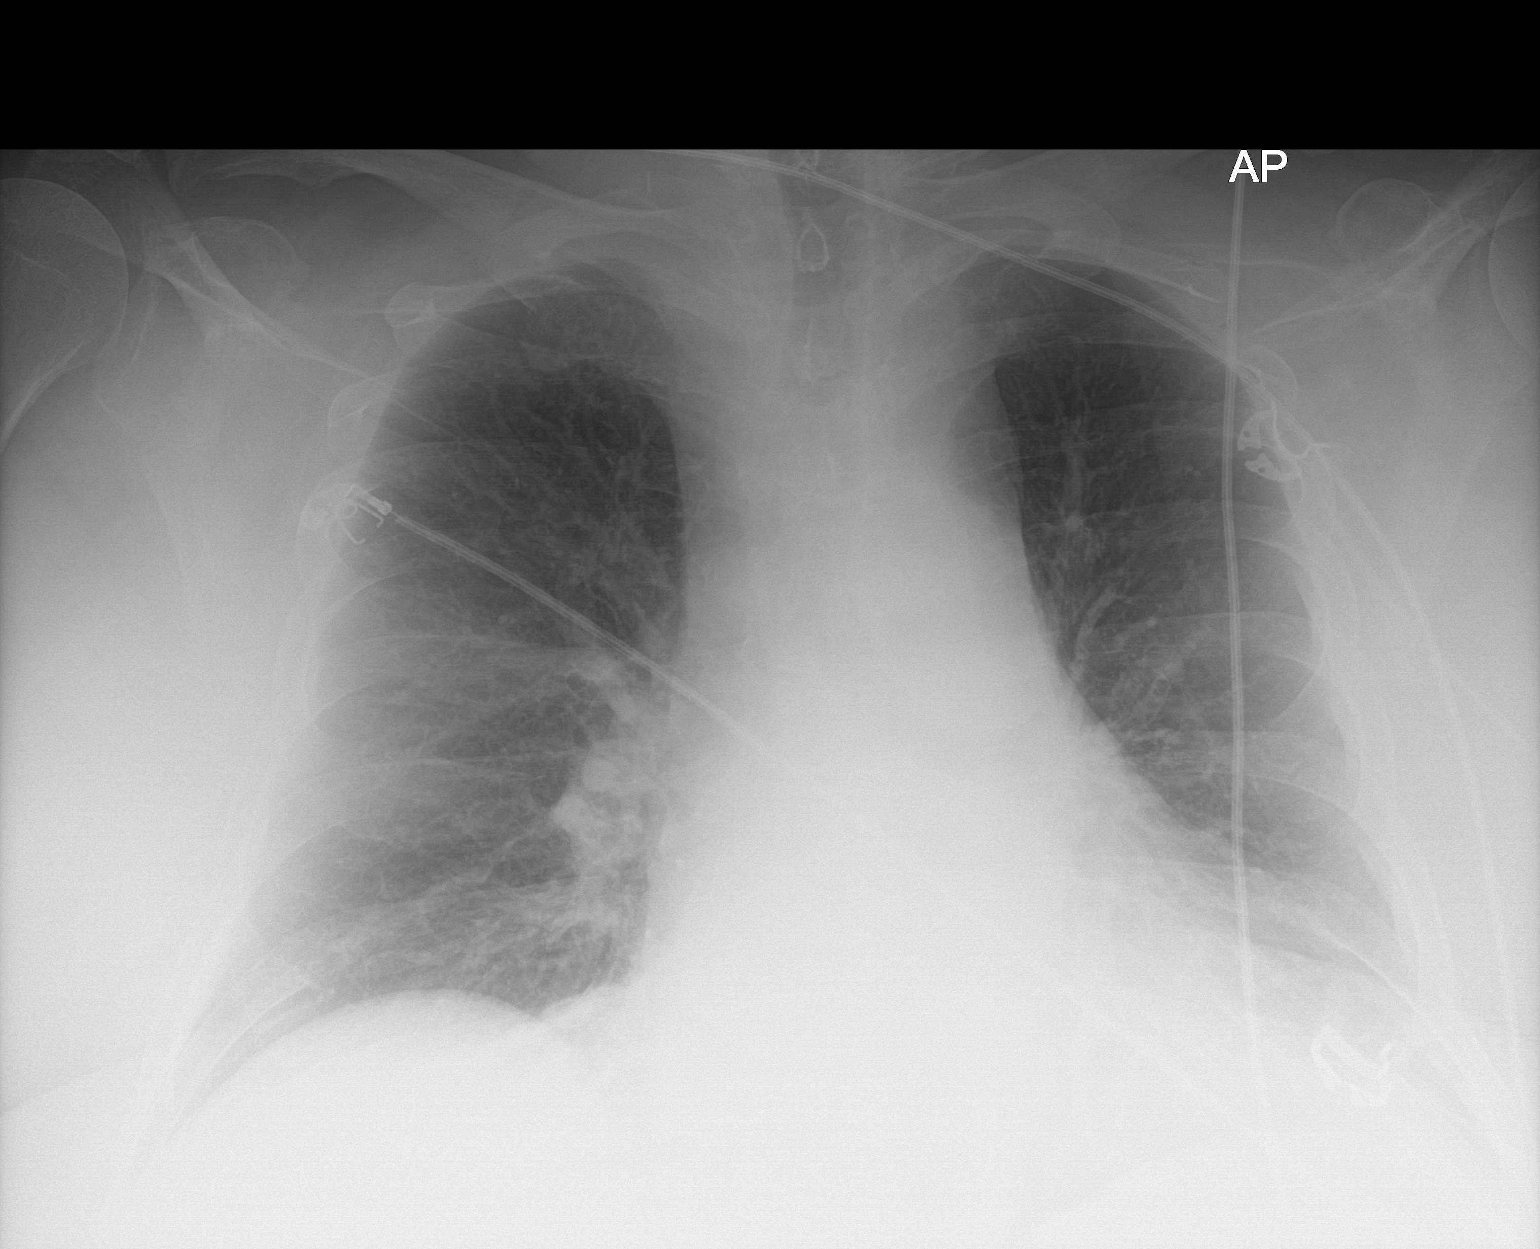

[1 of 1 positions shown; findings below may reference images not displayed]

FINDINGS: Cardiomegaly and mild pulmonary vascular congestion noted.

There is no evidence of focal airspace disease, pulmonary edema,
suspicious pulmonary nodule/mass, pleural effusion, or pneumothorax.

No acute bony abnormalities are identified.
IMPRESSION: Cardiomegaly with mild pulmonary vascular congestion.

## 2019-11-14 IMAGING — CT CT ANGIO CHEST
2 of 6 series · 18 of 46 positions shown · IV contrast (APPLIED)
Comparison: Chest x-ray on 12/26/2017

CLINICAL DATA: Hypertension, hyperlipidemia, DM2, and other
comorbidities who presented with chills and fall while getting out
of bed. Pt presented due to fall. He scraped his legs.

EXAM:
CT ANGIOGRAPHY CHEST WITH CONTRAST
TECHNIQUE: Multidetector CT imaging of the chest was performed using the
standard protocol during bolus administration of intravenous
contrast. Multiplanar CT image reconstructions and MIPs were
obtained to evaluate the vascular anatomy.
CONTRAST:  100mL [id number] IOPAMIDOL ([id number]) INJECTION 76%

[Series 5: thins · axial · 0.86mm/px · z∈[+1376,+1679]mm · 16 of 333 slices shown]
[im 15/333  lung]
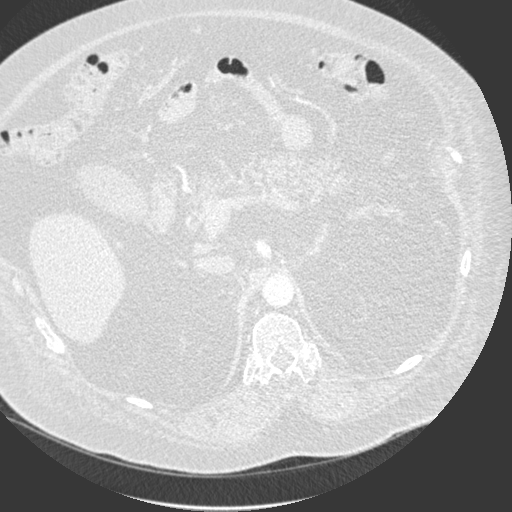
[im 44/333  soft-tissue]
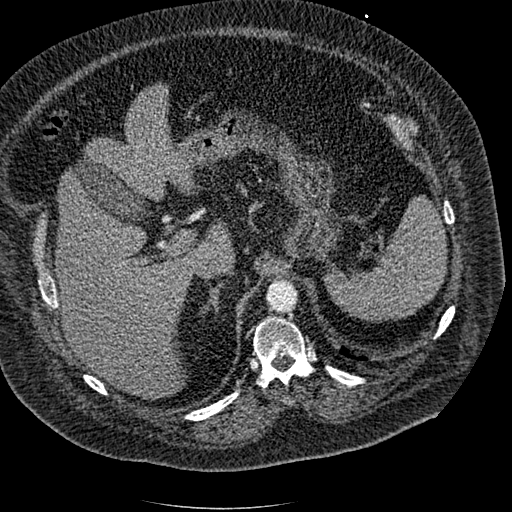
[im 58/333  lung]
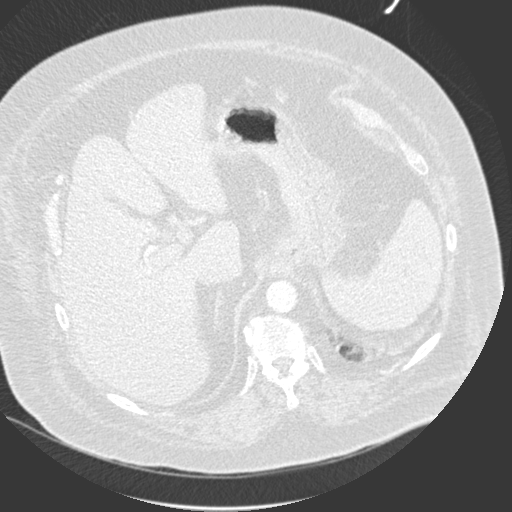
[im 73/333  soft-tissue]
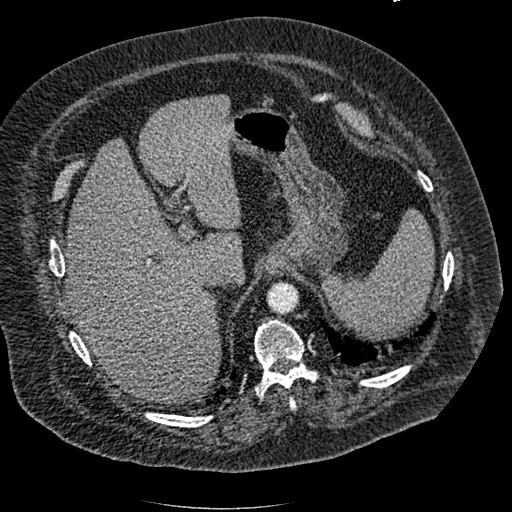
[im 102/333  lung]
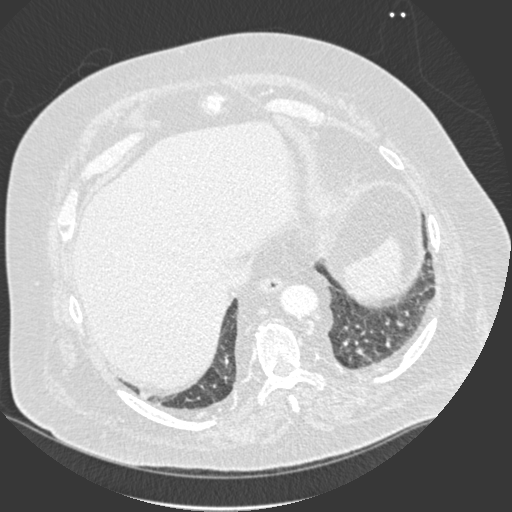
[im 116/333  soft-tissue]
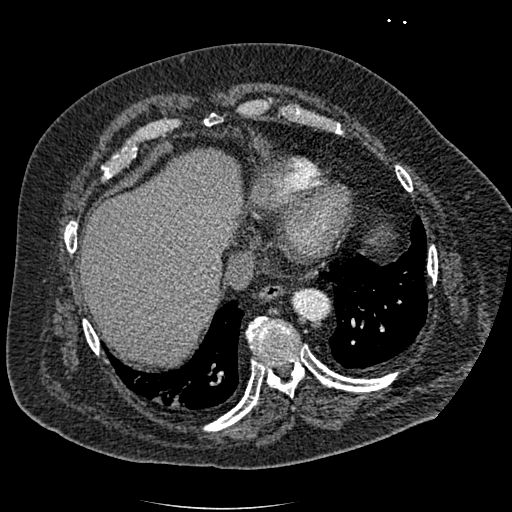
[im 130/333  lung]
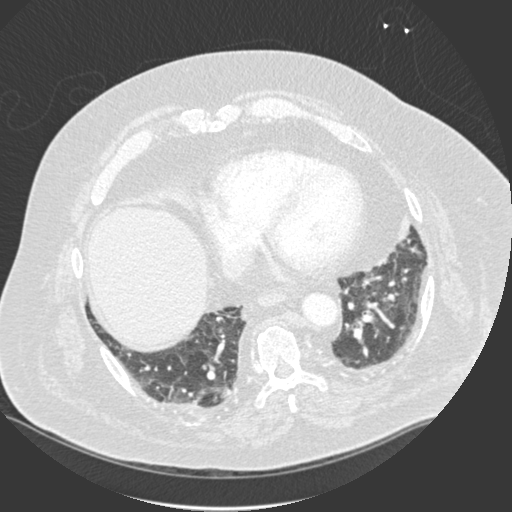
[im 159/333  soft-tissue]
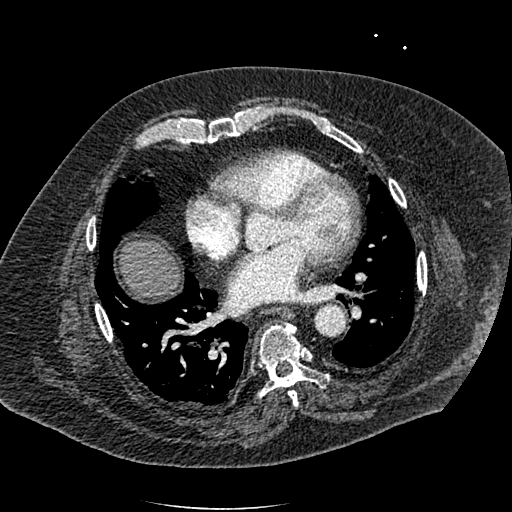
[im 174/333  lung]
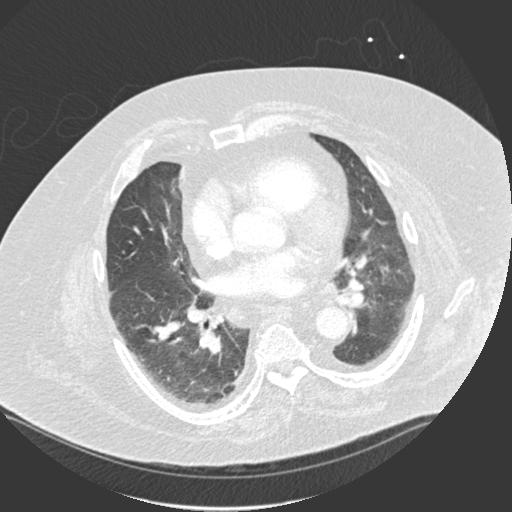
[im 203/333  soft-tissue]
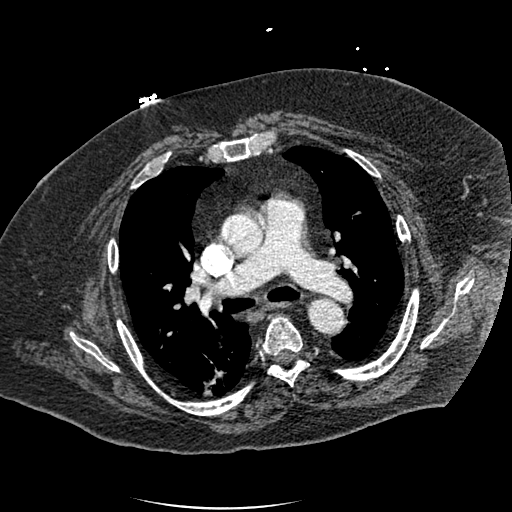
[im 217/333  lung]
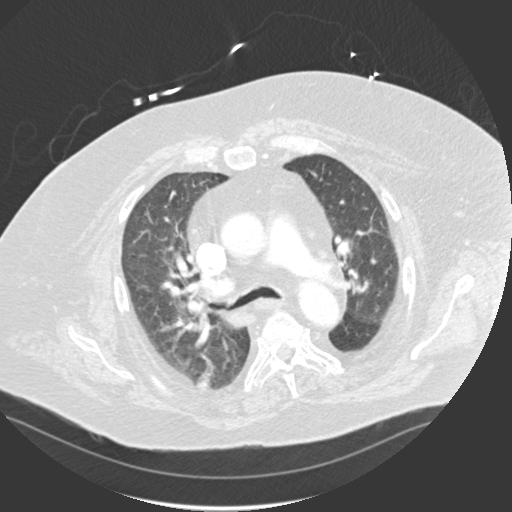
[im 231/333  soft-tissue]
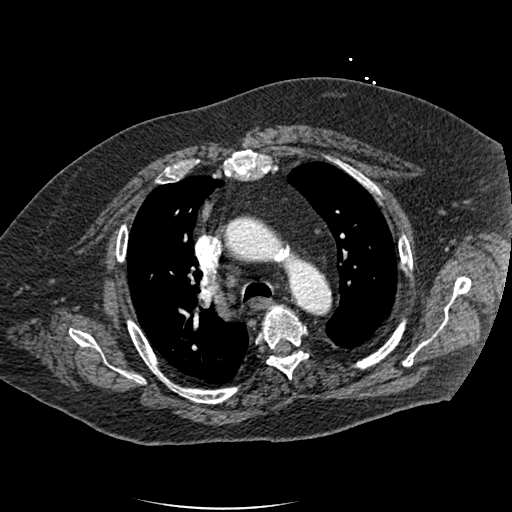
[im 260/333  lung]
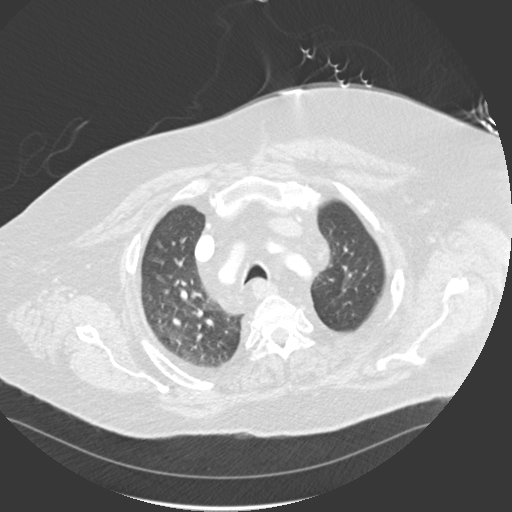
[im 275/333  soft-tissue]
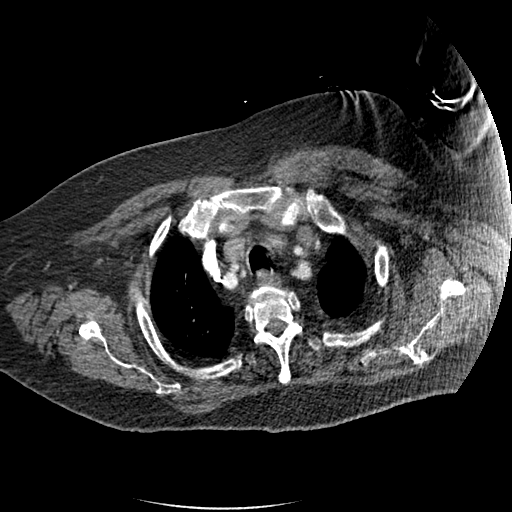
[im 289/333  lung]
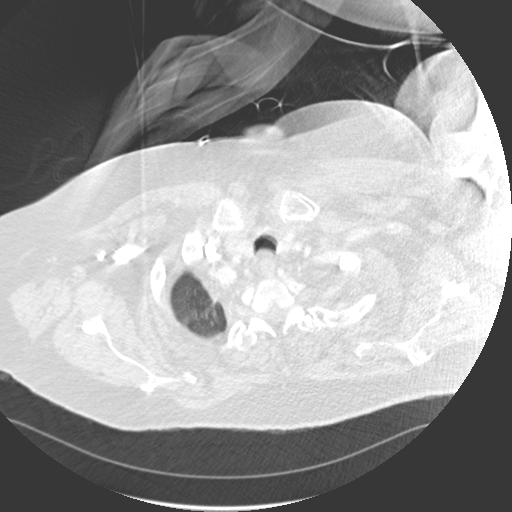
[im 318/333  soft-tissue]
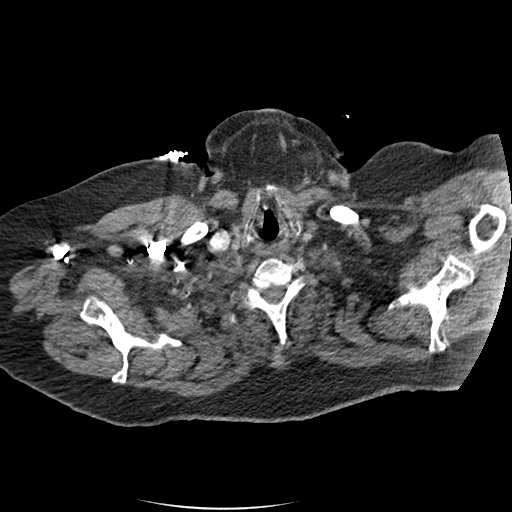

[Series 7: coronal mpr · coronal · 0.56mm/px · 2 of 106 slices shown]
[im 36/106  soft-tissue]
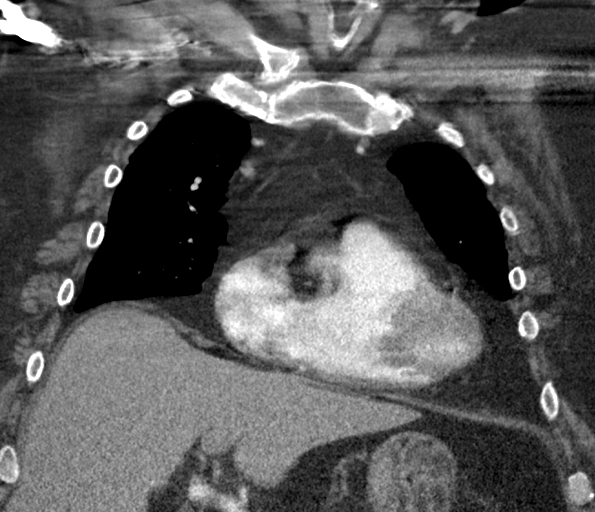
[im 71/106  soft-tissue]
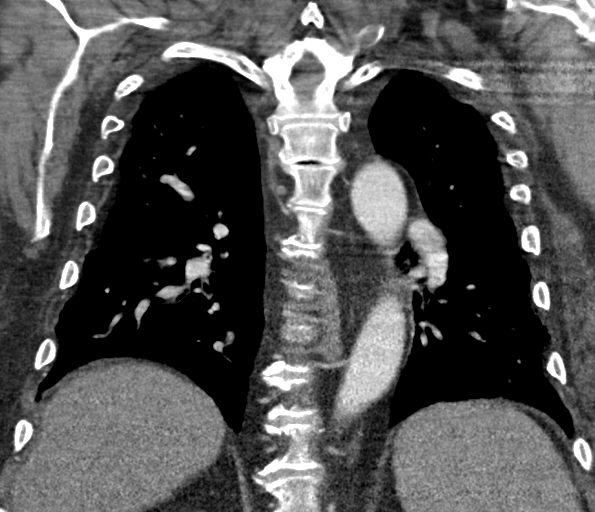

[18 of 46 positions shown; findings below may reference images not displayed]

FINDINGS: Cardiovascular: There is significant coronary artery calcification.
Heart size is normal. No pericardial effusion. The pulmonary
arteries are well opacified. There is artifact from patient body
habitus. However no large or segmental pulmonary emboli are
identified. Smaller emboli may be difficult to exclude. There is
atherosclerotic calcification of the thoracic aorta not associated
with aneurysm.

6

Mediastinum/Nodes: In the RIGHT paratracheal region there is a soft
tissue nodule measuring 1.8 x 2.4 centimeters. This is just below
the RIGHT lobe of the thyroid gland. Considerations include lymph
node or parathyroid adenoma. A subcarinal lymph node is
centimeters. The esophagus is normal in appearance. No mediastinal
or axillary adenopathy.

Lungs/Pleura: There is smooth septal thickening particularly within
the dependent aspects of the lungs. No focal consolidations or
pleural effusions. No pneumothorax. There is RIGHT LOWER lobe
atelectasis.

Upper Abdomen: Small lesion in the LEFT hepatic lobe is 8
millimeters and probably benign based on its size and appearance.
Gallbladder is present.

Musculoskeletal: There is irregularity of the anterior RIGHT ribs
consistent with remote fractures. No acute fractures are identified.
There is mild midthoracic spondylosis. No acute vertebral injury.

Review of the MIP images confirms the above findings.
IMPRESSION: 1. The exam is technically adequate to exclude a large in segmental
pulmonary emboli. Smaller emboli would be difficult to exclude due
to patient body habitus and associated artifact.
2. Mild parenchymal changes of pulmonary edema and RIGHT LOWER lobe
atelectasis.
3. Coronary artery calcification.
4.  Aortic atherosclerosis.  (TQBOH-385.5)
5. 1.8 x 2.4 centimeters soft tissue nodule in the superior
mediastinum could be a lymph node or parathyroid adenoma.
Correlation with labs is recommended.
6. No acute fracture.  Remote RIGHT anterior rib fractures.

## 2020-02-21 DIAGNOSIS — I10 Essential (primary) hypertension: Secondary | ICD-10-CM | POA: Diagnosis not present

## 2020-02-21 DIAGNOSIS — M545 Low back pain: Secondary | ICD-10-CM | POA: Diagnosis not present

## 2020-02-21 DIAGNOSIS — I700 Atherosclerosis of aorta: Secondary | ICD-10-CM | POA: Diagnosis not present

## 2020-02-21 DIAGNOSIS — E7849 Other hyperlipidemia: Secondary | ICD-10-CM | POA: Diagnosis not present

## 2020-02-21 DIAGNOSIS — E1129 Type 2 diabetes mellitus with other diabetic kidney complication: Secondary | ICD-10-CM | POA: Diagnosis not present

## 2020-03-05 ENCOUNTER — Inpatient Hospital Stay (HOSPITAL_COMMUNITY)
Admission: EM | Admit: 2020-03-05 | Discharge: 2020-03-18 | DRG: 208 | Disposition: E | Payer: PPO | Attending: General Surgery | Admitting: General Surgery

## 2020-03-05 ENCOUNTER — Other Ambulatory Visit: Payer: Self-pay

## 2020-03-05 ENCOUNTER — Encounter (HOSPITAL_COMMUNITY): Payer: Self-pay

## 2020-03-05 ENCOUNTER — Emergency Department (HOSPITAL_COMMUNITY): Payer: PPO

## 2020-03-05 DIAGNOSIS — R109 Unspecified abdominal pain: Secondary | ICD-10-CM

## 2020-03-05 DIAGNOSIS — S270XXA Traumatic pneumothorax, initial encounter: Principal | ICD-10-CM

## 2020-03-05 DIAGNOSIS — R52 Pain, unspecified: Secondary | ICD-10-CM | POA: Diagnosis not present

## 2020-03-05 DIAGNOSIS — Z885 Allergy status to narcotic agent status: Secondary | ICD-10-CM | POA: Diagnosis not present

## 2020-03-05 DIAGNOSIS — S2241XA Multiple fractures of ribs, right side, initial encounter for closed fracture: Secondary | ICD-10-CM

## 2020-03-05 DIAGNOSIS — J9690 Respiratory failure, unspecified, unspecified whether with hypoxia or hypercapnia: Secondary | ICD-10-CM

## 2020-03-05 DIAGNOSIS — Z7982 Long term (current) use of aspirin: Secondary | ICD-10-CM

## 2020-03-05 DIAGNOSIS — M7989 Other specified soft tissue disorders: Secondary | ICD-10-CM | POA: Diagnosis not present

## 2020-03-05 DIAGNOSIS — Z20822 Contact with and (suspected) exposure to covid-19: Secondary | ICD-10-CM | POA: Diagnosis present

## 2020-03-05 DIAGNOSIS — W19XXXA Unspecified fall, initial encounter: Secondary | ICD-10-CM

## 2020-03-05 DIAGNOSIS — I10 Essential (primary) hypertension: Secondary | ICD-10-CM | POA: Diagnosis present

## 2020-03-05 DIAGNOSIS — I959 Hypotension, unspecified: Secondary | ICD-10-CM | POA: Diagnosis not present

## 2020-03-05 DIAGNOSIS — Z6841 Body Mass Index (BMI) 40.0 and over, adult: Secondary | ICD-10-CM | POA: Diagnosis not present

## 2020-03-05 DIAGNOSIS — E1165 Type 2 diabetes mellitus with hyperglycemia: Secondary | ICD-10-CM | POA: Diagnosis present

## 2020-03-05 DIAGNOSIS — Z8249 Family history of ischemic heart disease and other diseases of the circulatory system: Secondary | ICD-10-CM

## 2020-03-05 DIAGNOSIS — G319 Degenerative disease of nervous system, unspecified: Secondary | ICD-10-CM | POA: Diagnosis not present

## 2020-03-05 DIAGNOSIS — E236 Other disorders of pituitary gland: Secondary | ICD-10-CM | POA: Diagnosis not present

## 2020-03-05 DIAGNOSIS — R001 Bradycardia, unspecified: Secondary | ICD-10-CM | POA: Diagnosis not present

## 2020-03-05 DIAGNOSIS — S2249XA Multiple fractures of ribs, unspecified side, initial encounter for closed fracture: Secondary | ICD-10-CM

## 2020-03-05 DIAGNOSIS — S0990XA Unspecified injury of head, initial encounter: Secondary | ICD-10-CM | POA: Diagnosis not present

## 2020-03-05 DIAGNOSIS — S79921A Unspecified injury of right thigh, initial encounter: Secondary | ICD-10-CM | POA: Diagnosis not present

## 2020-03-05 DIAGNOSIS — Z7984 Long term (current) use of oral hypoglycemic drugs: Secondary | ICD-10-CM | POA: Diagnosis not present

## 2020-03-05 DIAGNOSIS — S3991XA Unspecified injury of abdomen, initial encounter: Secondary | ICD-10-CM | POA: Diagnosis not present

## 2020-03-05 DIAGNOSIS — G9389 Other specified disorders of brain: Secondary | ICD-10-CM | POA: Diagnosis not present

## 2020-03-05 DIAGNOSIS — J90 Pleural effusion, not elsewhere classified: Secondary | ICD-10-CM | POA: Diagnosis not present

## 2020-03-05 DIAGNOSIS — S8991XA Unspecified injury of right lower leg, initial encounter: Secondary | ICD-10-CM | POA: Diagnosis not present

## 2020-03-05 DIAGNOSIS — I468 Cardiac arrest due to other underlying condition: Secondary | ICD-10-CM | POA: Diagnosis not present

## 2020-03-05 DIAGNOSIS — M25572 Pain in left ankle and joints of left foot: Secondary | ICD-10-CM | POA: Diagnosis not present

## 2020-03-05 DIAGNOSIS — Z823 Family history of stroke: Secondary | ICD-10-CM | POA: Diagnosis not present

## 2020-03-05 DIAGNOSIS — S199XXA Unspecified injury of neck, initial encounter: Secondary | ICD-10-CM | POA: Diagnosis not present

## 2020-03-05 DIAGNOSIS — J9600 Acute respiratory failure, unspecified whether with hypoxia or hypercapnia: Secondary | ICD-10-CM | POA: Diagnosis not present

## 2020-03-05 DIAGNOSIS — R079 Chest pain, unspecified: Secondary | ICD-10-CM | POA: Diagnosis present

## 2020-03-05 DIAGNOSIS — M25551 Pain in right hip: Secondary | ICD-10-CM | POA: Diagnosis not present

## 2020-03-05 DIAGNOSIS — J9811 Atelectasis: Secondary | ICD-10-CM | POA: Diagnosis not present

## 2020-03-05 DIAGNOSIS — S2231XA Fracture of one rib, right side, initial encounter for closed fracture: Secondary | ICD-10-CM | POA: Diagnosis not present

## 2020-03-05 DIAGNOSIS — J939 Pneumothorax, unspecified: Secondary | ICD-10-CM | POA: Diagnosis not present

## 2020-03-05 DIAGNOSIS — S99911A Unspecified injury of right ankle, initial encounter: Secondary | ICD-10-CM | POA: Diagnosis not present

## 2020-03-05 DIAGNOSIS — E785 Hyperlipidemia, unspecified: Secondary | ICD-10-CM | POA: Diagnosis present

## 2020-03-05 DIAGNOSIS — R10A1 Flank pain, right side: Secondary | ICD-10-CM

## 2020-03-05 DIAGNOSIS — Q048 Other specified congenital malformations of brain: Secondary | ICD-10-CM | POA: Diagnosis not present

## 2020-03-05 DIAGNOSIS — Z882 Allergy status to sulfonamides status: Secondary | ICD-10-CM

## 2020-03-05 DIAGNOSIS — M25519 Pain in unspecified shoulder: Secondary | ICD-10-CM | POA: Diagnosis not present

## 2020-03-05 LAB — COMPREHENSIVE METABOLIC PANEL
ALT: 44 U/L (ref 0–44)
AST: 58 U/L — ABNORMAL HIGH (ref 15–41)
Albumin: 3.7 g/dL (ref 3.5–5.0)
Alkaline Phosphatase: 71 U/L (ref 38–126)
Anion gap: 9 (ref 5–15)
BUN: 27 mg/dL — ABNORMAL HIGH (ref 8–23)
CO2: 25 mmol/L (ref 22–32)
Calcium: 8.6 mg/dL — ABNORMAL LOW (ref 8.9–10.3)
Chloride: 106 mmol/L (ref 98–111)
Creatinine, Ser: 1.21 mg/dL (ref 0.61–1.24)
GFR calc Af Amer: >60 mL/min (ref >60)
GFR calc non Af Amer: 60 mL/min — ABNORMAL LOW (ref >60)
Glucose, Bld: 219 mg/dL — ABNORMAL HIGH (ref 70–99)
Potassium: 4 mmol/L (ref 3.5–5.1)
Sodium: 140 mmol/L (ref 135–145)
Total Bilirubin: 0.6 mg/dL (ref 0.3–1.2)
Total Protein: 7 g/dL (ref 6.5–8.1)

## 2020-03-05 LAB — CBC
HCT: 40.5 % (ref 39.0–52.0)
Hemoglobin: 12.9 g/dL — ABNORMAL LOW (ref 13.0–17.0)
MCH: 30.6 pg (ref 26.0–34.0)
MCHC: 31.9 g/dL (ref 30.0–36.0)
MCV: 96.2 fL (ref 80.0–100.0)
Platelets: 248 10*3/uL (ref 150–400)
RBC: 4.21 MIL/uL — ABNORMAL LOW (ref 4.22–5.81)
RDW: 13.5 % (ref 11.5–15.5)
WBC: 9.1 10*3/uL (ref 4.0–10.5)
nRBC: 0 % (ref 0.0–0.2)

## 2020-03-05 LAB — LIPASE, BLOOD: Lipase: 23 U/L (ref 11–51)

## 2020-03-05 LAB — CBG MONITORING, ED: Glucose-Capillary: 220 mg/dL — ABNORMAL HIGH (ref 70–99)

## 2020-03-05 MED ORDER — IOHEXOL 300 MG/ML  SOLN
100.0000 mL | Freq: Once | INTRAMUSCULAR | Status: AC | PRN
  Administered 2020-03-05: 100 mL via INTRAVENOUS

## 2020-03-05 MED ORDER — FENTANYL CITRATE (PF) 100 MCG/2ML IJ SOLN
50.0000 ug | Freq: Once | INTRAMUSCULAR | Status: AC
  Administered 2020-03-06: 50 ug via INTRAVENOUS
  Filled 2020-03-05: qty 2

## 2020-03-05 MED ORDER — SODIUM CHLORIDE (PF) 0.9 % IJ SOLN
INTRAMUSCULAR | Status: AC
  Filled 2020-03-05: qty 50

## 2020-03-05 NOTE — ED Provider Notes (Signed)
Care assumed from Dr. Myrtis Ser.  Patient fell onto her right side hurting his chest, abdomen and hip.  Denies hitting his head or losing consciousness.  He has a history of obesity, hypertension hyperlipidemia.  No blood thinner use.  CT head and C-spine are negative for acute traumatic injury. CTs of the chest, abdomen and pelvis show multiple right-sided rib fractures with small pneumothorax.  No intra-abdominal injury.  Patient informed of concerning kidney mass and need for follow-up with urology.  Patient in no respiratory distress.  Saturating well on nasal cannula.  Discussed with general surgery Dr. Donell Beers who will arrange for admission to [facility] trauma service.  CRITICAL CARE Performed by: Glynn Octave Total critical care time: 32 minutes Critical care time was exclusive of separately billable procedures and treating other patients. Critical care was necessary to treat or prevent imminent or life-threatening deterioration. Critical care was time spent personally by me on the following activities: development of treatment plan with patient and/or surrogate as well as nursing, discussions with consultants, evaluation of patient's response to treatment, examination of patient, obtaining history from patient or surrogate, ordering and performing treatments and interventions, ordering and review of laboratory studies, ordering and review of radiographic studies, pulse oximetry and re-evaluation of patient's condition. Glynn Octave, MD 03/05/2020 [phone]

## 2020-03-05 NOTE — ED Triage Notes (Signed)
Patient brought in from home by EMS for fall outside of home, off ramp outside of door to house. Patient fell on right side, did not hit head, is not on blood thinners. Patient is diabetic, does not check BG at home, current BG is 214. Patient endorses pain on right side of body, patient also endorses discomfort when breathing in. Patient with chronic pain when ambulating at home, ambulates with cane. Patient given of Fentanyl en route to ED.

## 2020-03-05 NOTE — ED Provider Notes (Signed)
[facility]-EMERGENCY DEPT Provider Note   CSN: [id number] Arrival date & time: 03/13/2020  2032     History Chief Complaint  Patient presents with  . Fall    Martin Leach is a 69 y.o. male.   Fall This is a new problem. The current episode started 1 to 2 hours ago. The problem occurs rarely. The problem has not changed since onset.Associated symptoms include abdominal pain. Pertinent negatives include no chest pain, no headaches and no shortness of breath. Nothing aggravates the symptoms. Nothing relieves the symptoms. He has tried nothing for the symptoms. The treatment provided no relief.       Past Medical History:  Diagnosis Date  . Diabetes mellitus without complication (HCC)   . Hyperlipidemia   . Hypertension     Patient Active Problem List   Diagnosis Date Noted  . Multiple rib fractures Mar 23, 2020  . Metabolic syndrome 05/05/2018  . Venous stasis dermatitis of both lower extremities 05/05/2018  . Coronary artery calcification seen on CAT scan 01/30/2018  . Fall at home, initial encounter 12/27/2017  . Tachycardia 12/27/2017  . Diabetes mellitus type 2 in obese (HCC) 12/27/2017  . Essential hypertension 12/27/2017  . Hyperlipidemia associated with type 2 diabetes mellitus (HCC) 12/27/2017  . Morbid obesity with BMI of 50.0-59.9, adult (HCC) 12/27/2017  . Hypomagnesemia 12/27/2017    Past Surgical History:  Procedure Laterality Date  . 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 TONSILLECTOMY         Family History  Problem Relation Age of Onset  . Heart attack Mother   . Stroke Mother   . Stroke Father     Social History   Tobacco Use  . Smoking status: Never Smoker  . Smokeless tobacco: Never Used  Substance Use Topics  . Alcohol use: Yes    Comment: occasional cold beer  . Drug use: Never    Home Medications Prior to Admission medications   Medication Sig Start Date End Date Taking? Authorizing Provider  aspirin EC 81  MG tablet Take 81 mg by mouth daily.   Yes [provider]  Dulaglutide (TRULICITY) 1.5 MG/0.5ML SOPN Inject 1.5 mg into the skin once a week.   Yes [provider]  lisinopril-hydrochlorothiazide (PRINZIDE,ZESTORETIC) 20-12.5 MG tablet Take 1 tablet by mouth daily.  11/30/17  Yes [provider]  metFORMIN (GLUCOPHAGE) 1000 MG tablet Take 1,000 mg by mouth 2 (two) times daily with a meal.    Yes [provider]  Multiple Vitamins-Minerals (MULTIVITAMIN ADULTS) TABS Take 1 tablet by mouth daily.   Yes [provider]  pioglitazone (ACTOS) 30 MG tablet Take 30 mg by mouth daily.  11/30/17  Yes [provider]  rosuvastatin (CRESTOR) 40 MG tablet Take 40 mg by mouth every other day.  12/14/17  Yes [provider]    Allergies    Codeine and Sulfa antibiotics  Review of Systems   Review of Systems  Constitutional: Negative for chills and fever.  HENT: Negative for congestion and rhinorrhea.   Respiratory: Negative for cough and shortness of breath.   Cardiovascular: Negative for chest pain and palpitations.  Gastrointestinal: Positive for abdominal pain. Negative for diarrhea, nausea and vomiting.  Genitourinary: Positive for flank pain. Negative for difficulty urinating and dysuria.  Musculoskeletal: Positive for arthralgias (r shoulder and hip). Negative for back pain, neck pain and neck stiffness.  Skin: Negative for color change and rash.  Neurological: Negative for light-headedness and  headaches.    Physical Exam Updated Vital Signs BP (!) 143/73   Pulse 84   Temp 97.9 F (36.6 C) (Oral)   Resp 16   Ht 6\' 1"  (1.854 m)   Wt (!) 156 kg   SpO2 99%   BMI 45.39 kg/m   Physical Exam Vitals and nursing note reviewed. Exam conducted with a chaperone present.  Constitutional:      General: He is not in acute distress.    Appearance: Normal appearance.  HENT:     Head: Normocephalic and atraumatic.     Nose: No  rhinorrhea.  Eyes:     General:        Right eye: No discharge.        Left eye: No discharge.     Conjunctiva/sclera: Conjunctivae normal.  Cardiovascular:     Rate and Rhythm: Normal rate and regular rhythm.     Comments: Pulses present in all extremities Pulmonary:     Effort: Pulmonary effort is normal.     Breath sounds: No stridor. No wheezing.  Abdominal:     General: Abdomen is flat. There is no distension.     Palpations: Abdomen is soft.     Tenderness: There is abdominal tenderness (right sided). There is no guarding or rebound.     Comments: No bruising  Musculoskeletal:        General: Tenderness (right knee ttp, no deformity) present. No deformity or signs of injury.     Cervical back: Normal range of motion and neck supple. No tenderness.  Skin:    General: Skin is warm and dry.  Neurological:     General: No focal deficit present.     Mental Status: He is alert. Mental status is at baseline.     Motor: No weakness.     Comments: Motor function and sensation is present in all extremities  Psychiatric:        Mood and Affect: Mood normal.        Behavior: Behavior normal.        Thought Content: Thought content normal.     ED Results / Procedures / Treatments   Labs (all labs ordered are listed, but only abnormal results are displayed) Labs Reviewed  CBC - Abnormal; Notable for the following components:      Result Value   RBC 4.21 (*)    Hemoglobin 12.9 (*)    All other components within normal limits  COMPREHENSIVE METABOLIC PANEL - Abnormal; Notable for the following components:   Glucose, Bld 219 (*)    BUN 27 (*)    Calcium 8.6 (*)    AST 58 (*)    GFR calc non Af Amer 60 (*)    All other components within normal limits  URINALYSIS, ROUTINE W REFLEX MICROSCOPIC - Abnormal; Notable for the following components:   APPearance HAZY (*)    Specific Gravity, Urine 1.044 (*)    Glucose, UA 50 (*)    Hgb urine dipstick LARGE (*)    Ketones, ur 20 (*)     Nitrite POSITIVE (*)    Leukocytes,Ua LARGE (*)    WBC, UA >50 (*)    Bacteria, UA FEW (*)    All other components within normal limits  CBC - Abnormal; Notable for the following components:   RBC 4.03 (*)    Hemoglobin 12.3 (*)    All other components within normal limits  BASIC METABOLIC PANEL - Abnormal; Notable for the following components:  Glucose, Bld 303 (*)    BUN 25 (*)    All other components within normal limits  HEMOGLOBIN A1C - Abnormal; Notable for the following components:   Hgb A1c MFr Bld 10.1 (*)    All other components within normal limits  CBG MONITORING, ED - Abnormal; Notable for the following components:   Glucose-Capillary 220 (*)    All other components within normal limits  CBG MONITORING, ED - Abnormal; Notable for the following components:   Glucose-Capillary 295 (*)    All other components within normal limits  CBG MONITORING, ED - Abnormal; Notable for the following components:   Glucose-Capillary 294 (*)    All other components within normal limits  SARS CORONAVIRUS 2 BY RT PCR (HOSPITAL ORDER, PERFORMED IN [facility])  LIPASE, BLOOD    EKG None  Radiology DG Knee 2 Views Right  Result Date: 02/19/2020 CLINICAL DATA:  Fall, right knee pain EXAM: RIGHT KNEE - 1-2 VIEW COMPARISON:  None. FINDINGS: Two-view radiograph of the right knee demonstrates normal alignment. No fracture or dislocation. Mild medial compartment degenerative arthritis. No effusion. Soft tissues are unremarkable. IMPRESSION: Negative. Electronically Signed   By: Helyn Numbers MD   On: 03/09/2020 21:25   DG Ankle 2 Views Right  Result Date: 10-Mar-2020 CLINICAL DATA:  Fall, pain EXAM: RIGHT ANKLE - 2 VIEW COMPARISON:  None. FINDINGS: Diffuse soft tissue swelling. No acute fracture or dislocation. Joint space is preserved. IMPRESSION: No acute fracture. Electronically Signed   By: Guadlupe Spanish M.D.   On: 2020-03-10 08:21   CT Head Wo Contrast  Result Date:  03/08/2020 CLINICAL DATA:  Status post fall. EXAM: CT HEAD WITHOUT CONTRAST TECHNIQUE: Contiguous axial images were obtained from the base of the skull through the vertex without intravenous contrast. COMPARISON:  None. FINDINGS: Brain: There is mild cerebral atrophy with widening of the extra-axial spaces and ventricular dilatation. There are areas of decreased attenuation within the white matter tracts of the supratentorial brain, consistent with microvascular disease changes. Cavum septum pellucidum is noted. Vascular: No hyperdense vessel or unexpected calcification. Skull: Normal. Negative for fracture or focal lesion. Sinuses/Orbits: No acute finding. Other: None. IMPRESSION: 1. Generalized cerebral atrophy. 2. No acute intracranial abnormality. Electronically Signed   By: Aram Candela M.D.   On: 02/26/2020 23:18   CT Cervical Spine Wo Contrast  Result Date: 03/17/2020 CLINICAL DATA:  Status post fall. EXAM: CT CERVICAL SPINE WITHOUT CONTRAST TECHNIQUE: Multidetector CT imaging of the cervical spine was performed without intravenous contrast. Multiplanar CT image reconstructions were also generated. COMPARISON:  None. FINDINGS: Alignment: Normal. Skull base and vertebrae: No acute fracture. An 8 mm sclerotic focus seen along the left transverse process of the T2 vertebral body. Soft tissues and spinal canal: No prevertebral fluid or swelling. No visible canal hematoma. Disc levels: Mild multi the endplate sclerosis is seen with mild multilevel intervertebral disc space narrowing. There is mild bilateral multilevel facet joint hypertrophy is noted. Upper chest: Negative. Other: None. IMPRESSION: 1. No acute fracture or subluxation of the cervical spine. 2. 8 mm sclerotic focus seen along the left transverse process of the T2 vertebral body. This may represent a bone island. Electronically Signed   By: Aram Candela M.D.   On: 02/24/2020 23:21   CT CHEST ABDOMEN PELVIS W CONTRAST  Result Date:  03/15/2020 CLINICAL DATA:  Fall with right flank pain. EXAM: CT CHEST, ABDOMEN, AND PELVIS WITH CONTRAST TECHNIQUE: Multidetector CT imaging of the chest, abdomen and  pelvis was performed following the standard protocol during bolus administration of intravenous contrast. CONTRAST:  OMNIPAQUE IOHEXOL 300 MG/ML  SOLN COMPARISON:  CT dated Dec 27, 2017. FINDINGS: CT CHEST FINDINGS Cardiovascular: The heart size is normal. There is no evidence for thoracic aortic dissection. Mild atherosclerotic changes are noted. There is no large centrally located pulmonary embolism. Mediastinum/Nodes: -- No mediastinal lymphadenopathy. -- No hilar lymphadenopathy. -- No axillary lymphadenopathy. -- No supraclavicular lymphadenopathy. --again noted is a pedunculated thyroid nodule measuring approximately 2.1 cm (axial series 3, image 5). -  Unremarkable esophagus. Lungs/Pleura: There is a small right-sided pneumothorax. There is atelectasis at the right lung base. There is a small right-sided pleural effusion. The trachea is unremarkable Musculoskeletal: There are acute displaced and nondisplaced fractures involving the third through the third through ninth ribs on the right. CT ABDOMEN PELVIS FINDINGS Hepatobiliary: The liver is normal. Normal gallbladder.There is no biliary ductal dilation. Pancreas: Normal contours without ductal dilatation. No peripancreatic fluid collection. Spleen: Unremarkable. Adrenals/Urinary Tract: --Adrenal glands: Unremarkable. --Right kidney/ureter: No hydronephrosis or radiopaque kidney stones. --Left kidney/ureter: There is a solid-appearing mass arising from the upper pole the left kidney measuring approximately 6.3 x 6.1 cm (axial series 3, image 73). --Urinary bladder: There is a 2.3 cm stone in the urinary bladder. Stomach/Bowel: --Stomach/Duodenum: No hiatal hernia or other gastric abnormality. Normal duodenal course and caliber. --Small bowel: Unremarkable. --Colon: Rectosigmoid  diverticulosis without acute inflammation. --Appendix: Normal. Vascular/Lymphatic: Normal course and caliber of the major abdominal vessels. --No retroperitoneal lymphadenopathy. --No mesenteric lymphadenopathy. --No pelvic or inguinal lymphadenopathy. Reproductive: The prostate gland is enlarged. Other: No ascites or free air. There are bilateral fat containing inguinal hernias. There is a fat containing umbilical hernia. Musculoskeletal. No acute displaced fractures. IMPRESSION: 1. Small right-sided pneumothorax. 2. Acute displaced and nondisplaced fractures involving the third through ninth ribs on the right. 3. No acute intra-abdominal or pelvic injury. 4. Solid-appearing mass arising from the upper pole the left kidney measuring up to 6.3 cm. This is concerning for renal cell carcinoma until proven otherwise. Outpatient urology follow-up is recommended. 5. A 2.3 cm stone in the urinary bladder. 6. Enlarged prostate gland. 7. There is a 2.1 cm pedunculated thyroid nodule on the right. Recommend thyroid US (ref: J Am Coll Radiol. 2015 Feb;12(2): 143-50). Aortic Atherosclerosis (ICD10-I70.0). Electronically Signed   By: Katherine Mantle M.D.   On: 03/13/2020 23:27   DG Chest Portable 1 View  Result Date: 02/22/2020 CLINICAL DATA:  Status post fall. EXAM: PORTABLE CHEST 1 VIEW COMPARISON:  Dec 26, 2017 FINDINGS: Mild left basilar atelectasis and/or infiltrate is seen. A very small right-sided pleural effusion is noted. No pneumothorax is identified. The cardiac silhouette is moderately enlarged with prominent pericardial fat seen along the left lung base. An acute third right rib fracture is seen. IMPRESSION: 1. Mild left basilar atelectasis and/or infiltrate. 2. Very small right-sided pleural effusion. Electronically Signed   By: Aram Candela M.D.   On: 02/16/2020 23:37   DG Femur Min 2 Views Right  Result Date: 02/20/2020 CLINICAL DATA:  Fall, right hip pain EXAM: RIGHT FEMUR 2 VIEWS COMPARISON:   None. FINDINGS: Two view radiograph of the right femur demonstrates normal alignment. No fracture or dislocation. No knee effusion. Soft tissues are unremarkable. IMPRESSION: Negative. Electronically Signed   By: Helyn Numbers MD   On: 03/04/2020 21:24    Procedures Procedures (including critical care time)  Medications Ordered in ED Medications  sodium chloride (PF) 0.9 % injection (has  no administration in time range)  aspirin EC tablet 81 mg (81 mg Oral Given 02/18/2020 1026)  rosuvastatin (CRESTOR) tablet 40 mg (has no administration in time range)  enoxaparin (LOVENOX) injection 40 mg (40 mg Subcutaneous Given 02/28/2020 0822)  dextrose 5 % and 0.45 % NaCl with KCl 10 mEq/L infusion (has no administration in time range)  traMADol (ULTRAM) tablet 50 mg (50 mg Oral Given 02/23/2020 1026)  oxyCODONE (Oxy IR/ROXICODONE) immediate release tablet 2.5-5 mg (has no administration in time range)  morphine 2 MG/ML injection 1-2 mg (2 mg Intravenous Given 02/29/2020 0339)  insulin aspart (novoLOG) injection 0-15 Units (8 Units Subcutaneous Given 02/25/2020 1344)  lisinopril (ZESTRIL) tablet 20 mg (20 mg Oral Given 03/03/2020 1026)    And  hydrochlorothiazide (MICROZIDE) capsule 12.5 mg (12.5 mg Oral Given 02/24/2020 1026)  Dulaglutide SOPN 1.5 mg (1.5 mg Subcutaneous Not Given 03/09/2020 1345)  iohexol (OMNIPAQUE) 300 MG/ML solution 100 mL (100 mLs Intravenous Contrast Given 03/23/20 2242)  fentaNYL (SUBLIMAZE) injection 50 mcg (50 mcg Intravenous Given 03/16/2020 0011)    ED Course  I have reviewed the triage vital signs and the nursing notes.  Pertinent labs & imaging results that were available during my care of the patient were reviewed by me and considered in my medical decision making (see chart for details).    MDM Rules/Calculators/A&P                          69 year old male, morbidly obese, who was ambulating and lost balance and fell.  Comes to us via EMS with right-sided flank pain, hip and knee pain.   He has no obvious deformity, he is hemodynamically stable mentating normally.  No neurologic deficits.  Upon examination he has tenderness on his right flank and abdomen as well as right knee.  He is able to range all his extremities, he will get imaging to evaluate for hollow viscus or solid organ injuries of the abdomen, bony injuries of the thorax and spine.  He will get head imaging and neck imaging.  On clinical exam he has no midline neck tenderness full range of motion and no neurologic deficits.  Labs regarding trauma work-up are unremarkable for any significant organ dysfunction or electrolyte abnormalities, does show hyperglycemia.  No complications related to the hyperglycemia.  Imaging of the knee and femur were done with plain films and are negative after radiology my review.  Still awaiting CT scans of the likely home if there are no injuries, consult specialist if injuries were found.  CT imaging of the head after my review and radiology review shows no acute fracture or malalignment or intracranial process.  CT chest abdomen pelvis is still pending, there does appear to be a small pneumothorax on the right, the patient has no respiratory symptoms feels he can breathe fully.  Chest x-ray will be obtained and the general surgery team will be consulted.  Pt care was handed off to on coming provider at 2330.  Complete history and physical and current plan have been communicated.  Please refer to their note for the remainder of ED care and ultimate disposition.    More Final Clinical Impression(s) / ED Diagnoses Final diagnoses:  Fall  Right flank pain  Closed fracture of multiple ribs of right side, initial encounter  Traumatic pneumothorax, initial encounter  Multiple rib fractures    Rx / DC Orders ED Discharge Orders    None  Sabino Donovan, MD 2020-03-24 1500

## 2020-03-06 ENCOUNTER — Inpatient Hospital Stay (HOSPITAL_COMMUNITY): Payer: PPO

## 2020-03-06 DIAGNOSIS — I10 Essential (primary) hypertension: Secondary | ICD-10-CM | POA: Diagnosis present

## 2020-03-06 DIAGNOSIS — E1165 Type 2 diabetes mellitus with hyperglycemia: Secondary | ICD-10-CM | POA: Diagnosis present

## 2020-03-06 DIAGNOSIS — W19XXXA Unspecified fall, initial encounter: Secondary | ICD-10-CM | POA: Diagnosis present

## 2020-03-06 DIAGNOSIS — Z6841 Body Mass Index (BMI) 40.0 and over, adult: Secondary | ICD-10-CM | POA: Diagnosis not present

## 2020-03-06 DIAGNOSIS — Z20822 Contact with and (suspected) exposure to covid-19: Secondary | ICD-10-CM | POA: Diagnosis present

## 2020-03-06 DIAGNOSIS — E785 Hyperlipidemia, unspecified: Secondary | ICD-10-CM | POA: Diagnosis present

## 2020-03-06 DIAGNOSIS — S270XXA Traumatic pneumothorax, initial encounter: Secondary | ICD-10-CM | POA: Diagnosis present

## 2020-03-06 DIAGNOSIS — J9690 Respiratory failure, unspecified, unspecified whether with hypoxia or hypercapnia: Secondary | ICD-10-CM | POA: Diagnosis present

## 2020-03-06 DIAGNOSIS — S2249XA Multiple fractures of ribs, unspecified side, initial encounter for closed fracture: Secondary | ICD-10-CM | POA: Diagnosis present

## 2020-03-06 DIAGNOSIS — Z7984 Long term (current) use of oral hypoglycemic drugs: Secondary | ICD-10-CM | POA: Diagnosis not present

## 2020-03-06 DIAGNOSIS — R001 Bradycardia, unspecified: Secondary | ICD-10-CM | POA: Diagnosis not present

## 2020-03-06 DIAGNOSIS — I468 Cardiac arrest due to other underlying condition: Secondary | ICD-10-CM | POA: Diagnosis not present

## 2020-03-06 DIAGNOSIS — Z8249 Family history of ischemic heart disease and other diseases of the circulatory system: Secondary | ICD-10-CM | POA: Diagnosis not present

## 2020-03-06 DIAGNOSIS — S2241XA Multiple fractures of ribs, right side, initial encounter for closed fracture: Secondary | ICD-10-CM | POA: Diagnosis present

## 2020-03-06 DIAGNOSIS — Z7982 Long term (current) use of aspirin: Secondary | ICD-10-CM | POA: Diagnosis not present

## 2020-03-06 DIAGNOSIS — Z885 Allergy status to narcotic agent status: Secondary | ICD-10-CM | POA: Diagnosis not present

## 2020-03-06 DIAGNOSIS — R079 Chest pain, unspecified: Secondary | ICD-10-CM | POA: Diagnosis present

## 2020-03-06 DIAGNOSIS — Z882 Allergy status to sulfonamides status: Secondary | ICD-10-CM | POA: Diagnosis not present

## 2020-03-06 DIAGNOSIS — Z823 Family history of stroke: Secondary | ICD-10-CM | POA: Diagnosis not present

## 2020-03-06 LAB — CBC
HCT: 39 % (ref 39.0–52.0)
HCT: 39.5 % (ref 39.0–52.0)
Hemoglobin: 12.3 g/dL — ABNORMAL LOW (ref 13.0–17.0)
Hemoglobin: 11 g/dL — ABNORMAL LOW (ref 13.0–17.0)
MCH: 30.5 pg (ref 26.0–34.0)
MCH: 30.1 pg (ref 26.0–34.0)
MCHC: 31.5 g/dL (ref 30.0–36.0)
MCHC: 27.8 g/dL — ABNORMAL LOW (ref 30.0–36.0)
MCV: 96.8 fL (ref 80.0–100.0)
MCV: 107.9 fL — ABNORMAL HIGH (ref 80.0–100.0)
Platelets: 237 10*3/uL (ref 150–400)
Platelets: 193 10*3/uL (ref 150–400)
RBC: 4.03 MIL/uL — ABNORMAL LOW (ref 4.22–5.81)
RBC: 3.66 MIL/uL — ABNORMAL LOW (ref 4.22–5.81)
RDW: 13.5 % (ref 11.5–15.5)
RDW: 13.5 % (ref 11.5–15.5)
WBC: 10.5 10*3/uL (ref 4.0–10.5)
WBC: 22.3 10*3/uL — ABNORMAL HIGH (ref 4.0–10.5)
nRBC: 0 % (ref 0.0–0.2)
nRBC: 0.2 % (ref 0.0–0.2)

## 2020-03-06 LAB — I-STAT CHEM 8, ED
BUN: 26 mg/dL — ABNORMAL HIGH (ref 8–23)
Calcium, Ion: 1.22 mmol/L (ref 1.15–1.40)
Chloride: 110 mmol/L (ref 98–111)
Creatinine, Ser: 1.6 mg/dL — ABNORMAL HIGH (ref 0.61–1.24)
Glucose, Bld: 411 mg/dL — ABNORMAL HIGH (ref 70–99)
HCT: 33 % — ABNORMAL LOW (ref 39.0–52.0)
Hemoglobin: 11.2 g/dL — ABNORMAL LOW (ref 13.0–17.0)
Potassium: 6.9 mmol/L (ref 3.5–5.1)
Sodium: 142 mmol/L (ref 135–145)
TCO2: 16 mmol/L — ABNORMAL LOW (ref 22–32)

## 2020-03-06 LAB — COMPREHENSIVE METABOLIC PANEL
ALT: 103 U/L — ABNORMAL HIGH (ref 0–44)
AST: 139 U/L — ABNORMAL HIGH (ref 15–41)
Albumin: 2.4 g/dL — ABNORMAL LOW (ref 3.5–5.0)
Alkaline Phosphatase: 90 U/L (ref 38–126)
Anion gap: 19 — ABNORMAL HIGH (ref 5–15)
BUN: 22 mg/dL (ref 8–23)
CO2: 9 mmol/L — ABNORMAL LOW (ref 22–32)
Calcium: 8.6 mg/dL — ABNORMAL LOW (ref 8.9–10.3)
Chloride: 113 mmol/L — ABNORMAL HIGH (ref 98–111)
Creatinine, Ser: 1.66 mg/dL — ABNORMAL HIGH (ref 0.61–1.24)
GFR calc Af Amer: 47 mL/min — ABNORMAL LOW (ref >60)
GFR calc non Af Amer: 41 mL/min — ABNORMAL LOW (ref >60)
Glucose, Bld: 423 mg/dL — ABNORMAL HIGH (ref 70–99)
Potassium: 7.1 mmol/L (ref 3.5–5.1)
Sodium: 141 mmol/L (ref 135–145)
Total Bilirubin: 1 mg/dL (ref 0.3–1.2)
Total Protein: 5.2 g/dL — ABNORMAL LOW (ref 6.5–8.1)

## 2020-03-06 LAB — URINALYSIS, ROUTINE W REFLEX MICROSCOPIC
Bilirubin Urine: NEGATIVE
Glucose, UA: 50 mg/dL — AB
Ketones, ur: 20 mg/dL — AB
Nitrite: POSITIVE — AB
Protein, ur: NEGATIVE mg/dL
Specific Gravity, Urine: 1.044 — ABNORMAL HIGH (ref 1.005–1.030)
WBC, UA: >50 WBC/hpf — ABNORMAL HIGH (ref 0–5)
pH: 5 (ref 5.0–8.0)

## 2020-03-06 LAB — BASIC METABOLIC PANEL
Anion gap: 11 (ref 5–15)
BUN: 25 mg/dL — ABNORMAL HIGH (ref 8–23)
CO2: 25 mmol/L (ref 22–32)
Calcium: 9 mg/dL (ref 8.9–10.3)
Chloride: 104 mmol/L (ref 98–111)
Creatinine, Ser: 1.1 mg/dL (ref 0.61–1.24)
GFR calc Af Amer: >60 mL/min (ref >60)
GFR calc non Af Amer: >60 mL/min (ref >60)
Glucose, Bld: 303 mg/dL — ABNORMAL HIGH (ref 70–99)
Potassium: 4 mmol/L (ref 3.5–5.1)
Sodium: 140 mmol/L (ref 135–145)

## 2020-03-06 LAB — CBG MONITORING, ED
Glucose-Capillary: 295 mg/dL — ABNORMAL HIGH (ref 70–99)
Glucose-Capillary: 294 mg/dL — ABNORMAL HIGH (ref 70–99)
Glucose-Capillary: 225 mg/dL — ABNORMAL HIGH (ref 70–99)
Glucose-Capillary: 226 mg/dL — ABNORMAL HIGH (ref 70–99)
Glucose-Capillary: 341 mg/dL — ABNORMAL HIGH (ref 70–99)

## 2020-03-06 LAB — PHOSPHORUS: Phosphorus: 10.4 mg/dL — ABNORMAL HIGH (ref 2.5–4.6)

## 2020-03-06 LAB — MAGNESIUM: Magnesium: 2.4 mg/dL (ref 1.7–2.4)

## 2020-03-06 LAB — HEMOGLOBIN A1C
Hgb A1c MFr Bld: 10.1 % — ABNORMAL HIGH (ref 4.8–5.6)
Mean Plasma Glucose: 243.17 mg/dL

## 2020-03-06 LAB — SARS CORONAVIRUS 2 BY RT PCR (HOSPITAL ORDER, PERFORMED IN ~~LOC~~ HOSPITAL LAB): SARS Coronavirus 2: NEGATIVE

## 2020-03-06 LAB — LACTIC ACID, PLASMA: Lactic Acid, Venous: >11 mmol/L (ref 0.5–1.9)

## 2020-03-06 MED ORDER — FENTANYL CITRATE (PF) 100 MCG/2ML IJ SOLN
50.0000 ug | Freq: Once | INTRAMUSCULAR | Status: AC
  Administered 2020-03-06: 50 ug via INTRAVENOUS

## 2020-03-06 MED ORDER — SODIUM CHLORIDE 0.9 % IV SOLN
2.0000 g | Freq: Three times a day (TID) | INTRAVENOUS | Status: DC
  Administered 2020-03-06: 2 g via INTRAVENOUS
  Filled 2020-03-06: qty 2

## 2020-03-06 MED ORDER — FENTANYL CITRATE (PF) 100 MCG/2ML IJ SOLN
INTRAMUSCULAR | Status: AC
  Filled 2020-03-06: qty 2

## 2020-03-06 MED ORDER — MIDAZOLAM HCL 2 MG/2ML IJ SOLN: 1.0000 mg | INTRAMUSCULAR | Status: DC | PRN

## 2020-03-06 MED ORDER — CALCIUM CHLORIDE 10 % IV SOLN
INTRAVENOUS | Status: AC | PRN
  Administered 2020-03-06: 1 g via INTRAVENOUS

## 2020-03-06 MED ORDER — EPINEPHRINE 1 MG/10ML IJ SOSY
PREFILLED_SYRINGE | INTRAMUSCULAR | Status: AC | PRN
  Administered 2020-03-06: 1 via INTRAVENOUS
  Administered 2020-03-06: 0.2 mg via INTRAVENOUS
  Administered 2020-03-06: 1 via INTRAVENOUS
  Administered 2020-03-06: 0.5 mg via INTRAVENOUS
  Administered 2020-03-06 (×2): 1 via INTRAVENOUS

## 2020-03-06 MED ORDER — LISINOPRIL-HYDROCHLOROTHIAZIDE 20-12.5 MG PO TABS: 1.0000 | ORAL_TABLET | Freq: Every day | ORAL | Status: DC

## 2020-03-06 MED ORDER — SODIUM BICARBONATE 8.4 % IV SOLN
INTRAVENOUS | Status: AC | PRN
  Administered 2020-03-06: 50 meq via INTRAVENOUS

## 2020-03-06 MED ORDER — MIDAZOLAM 50MG/50ML (1MG/ML) PREMIX INFUSION
0.5000 mg/h | INTRAVENOUS | Status: DC
  Administered 2020-03-06: 2 mg/h via INTRAVENOUS
  Filled 2020-03-06: qty 50

## 2020-03-06 MED ORDER — DULAGLUTIDE 1.5 MG/0.5ML ~~LOC~~ SOAJ: 1.5000 mg | SUBCUTANEOUS | Status: DC

## 2020-03-06 MED ORDER — FENTANYL BOLUS VIA INFUSION
50.0000 ug | INTRAVENOUS | Status: DC | PRN
  Filled 2020-03-06: qty 50

## 2020-03-06 MED ORDER — LISINOPRIL 20 MG PO TABS
20.0000 mg | ORAL_TABLET | Freq: Every day | ORAL | Status: DC
  Administered 2020-03-06: 20 mg via ORAL
  Filled 2020-03-06: qty 1

## 2020-03-06 MED ORDER — MORPHINE SULFATE (PF) 2 MG/ML IV SOLN
1.0000 mg | INTRAVENOUS | Status: DC | PRN
  Administered 2020-03-06: 2 mg via INTRAVENOUS
  Filled 2020-03-06: qty 1

## 2020-03-06 MED ORDER — EPINEPHRINE 1 MG/10ML IJ SOSY
PREFILLED_SYRINGE | INTRAMUSCULAR | Status: AC | PRN
  Administered 2020-03-06 (×11): 1 via INTRAVENOUS

## 2020-03-06 MED ORDER — ROSUVASTATIN CALCIUM 20 MG PO TABS: 40.0000 mg | ORAL_TABLET | ORAL | Status: DC

## 2020-03-06 MED ORDER — EPINEPHRINE HCL 5 MG/250ML IV SOLN IN NS
0.5000 ug/min | INTRAVENOUS | Status: DC
  Administered 2020-03-06: 150 ug/min via INTRAVENOUS
  Administered 2020-03-06: 100 ug/min via INTRAVENOUS
  Filled 2020-03-06 (×4): qty 250

## 2020-03-06 MED ORDER — FENTANYL CITRATE (PF) 100 MCG/2ML IJ SOLN
100.0000 ug | Freq: Once | INTRAMUSCULAR | Status: AC
  Administered 2020-03-06: 100 ug via INTRAVENOUS

## 2020-03-06 MED ORDER — NOREPINEPHRINE 4 MG/250ML-% IV SOLN
0.0000 ug/min | INTRAVENOUS | Status: DC
  Administered 2020-03-06: 2 ug/min via INTRAVENOUS
  Administered 2020-03-06: 5 ug/min via INTRAVENOUS
  Filled 2020-03-06: qty 250

## 2020-03-06 MED ORDER — FENTANYL BOLUS VIA INFUSION
25.0000 ug | INTRAVENOUS | Status: DC | PRN
  Filled 2020-03-06: qty 25

## 2020-03-06 MED ORDER — INSULIN ASPART 100 UNIT/ML ~~LOC~~ SOLN
0.0000 [IU] | Freq: Three times a day (TID) | SUBCUTANEOUS | Status: DC
  Administered 2020-03-06: 8 [IU] via SUBCUTANEOUS
  Administered 2020-03-06: 5 [IU] via SUBCUTANEOUS
  Administered 2020-03-06: 8 [IU] via SUBCUTANEOUS
  Filled 2020-03-06: qty 0.15

## 2020-03-06 MED ORDER — FENTANYL 2500MCG IN NS 250ML (10MCG/ML) PREMIX INFUSION
25.0000 ug/h | INTRAVENOUS | Status: DC
  Filled 2020-03-06: qty 250

## 2020-03-06 MED ORDER — EPINEPHRINE 1 MG/10ML IJ SOSY
PREFILLED_SYRINGE | INTRAMUSCULAR | Status: AC | PRN
  Administered 2020-03-06 (×2): 1 via INTRAVENOUS

## 2020-03-06 MED ORDER — ENOXAPARIN SODIUM 40 MG/0.4ML ~~LOC~~ SOLN: 40.0000 mg | Freq: Two times a day (BID) | SUBCUTANEOUS | Status: DC

## 2020-03-06 MED ORDER — FENTANYL CITRATE (PF) 100 MCG/2ML IJ SOLN: 25.0000 ug | Freq: Once | INTRAMUSCULAR | Status: AC

## 2020-03-06 MED ORDER — ASPIRIN EC 81 MG PO TBEC
81.0000 mg | DELAYED_RELEASE_TABLET | Freq: Every day | ORAL | Status: DC
  Administered 2020-03-06: 81 mg via ORAL
  Filled 2020-03-06: qty 1

## 2020-03-06 MED ORDER — SODIUM BICARBONATE 8.4 % IV SOLN
INTRAVENOUS | Status: AC | PRN
  Administered 2020-03-06 (×2): 50 meq via INTRAVENOUS

## 2020-03-06 MED ORDER — OXYCODONE HCL 5 MG PO TABS: 2.5000 mg | ORAL_TABLET | Freq: Four times a day (QID) | ORAL | Status: DC | PRN

## 2020-03-06 MED ORDER — FENTANYL 2500MCG IN NS 250ML (10MCG/ML) PREMIX INFUSION
50.0000 ug/h | INTRAVENOUS | Status: DC
  Administered 2020-03-06: 50 ug/h via INTRAVENOUS

## 2020-03-06 MED ORDER — KCL IN DEXTROSE-NACL 10-5-0.45 MEQ/L-%-% IV SOLN
INTRAVENOUS | Status: DC
  Filled 2020-03-06: qty 1000

## 2020-03-06 MED ORDER — HYDROCHLOROTHIAZIDE 12.5 MG PO CAPS
12.5000 mg | ORAL_CAPSULE | Freq: Every day | ORAL | Status: DC
  Administered 2020-03-06: 12.5 mg via ORAL
  Filled 2020-03-06: qty 1

## 2020-03-06 MED ORDER — ENOXAPARIN SODIUM 40 MG/0.4ML ~~LOC~~ SOLN
40.0000 mg | SUBCUTANEOUS | Status: DC
  Administered 2020-03-06: 40 mg via SUBCUTANEOUS
  Filled 2020-03-06: qty 0.4

## 2020-03-06 MED ORDER — TRAMADOL HCL 50 MG PO TABS
50.0000 mg | ORAL_TABLET | Freq: Four times a day (QID) | ORAL | Status: DC | PRN
  Administered 2020-03-06 (×2): 50 mg via ORAL
  Filled 2020-03-06 (×2): qty 1

## 2020-03-06 MED FILL — Medication: Qty: 2 | Status: AC

## 2020-03-07 MED FILL — Medication: Qty: 1 | Status: AC

## 2020-03-15 MED FILL — Sodium Chloride IV Soln 0.9%: INTRAVENOUS | Qty: 250 | Status: AC

## 2020-03-15 MED FILL — Norepinephrine Bitartrate IV Soln 1 MG/ML (Base Equivalent): INTRAVENOUS | Qty: 4 | Status: AC

## 2020-03-18 NOTE — Code Documentation (Signed)
Pulse check, continue compressions.

## 2020-03-18 NOTE — Code Documentation (Signed)
Patient time of death occurred at 22:37. By Dr. Bedelia Person.

## 2020-03-18 NOTE — ED Provider Notes (Signed)
Patient transferred from [facility] for emergent trauma evaluation after he had cardiac arrest.  Patient arrived on epi drip and intubated.   On arrival, airway appears to been secured with intubation and breath sounds are equal bilaterally.  He is not hypotensive on arrival.  Trauma was at the bedside and patient was taken to the CT scanner for repeat CT imaging of his head, neck, and chest/abdomen/pelvis.  Upon arrival back from the CT scanner, patient went back into cardiac arrest.  After ROSC, repeat chest x-ray showed concern for worsened right-sided pneumothorax.  Trauma team to place chest tube.  Patient then went back into cardiac arrest and subsequently expired.  Will defer to trauma documentation during the code as I was not present during the final cardiac arrest.  Time of death was 2235/12/20.  Trauma team reported to me that they spoke to family already.   11:14 PM Just spoke to the medical examiner who will take the case and will do the certificate given the likely traumatic nature of the patient's death.    Clinical Impression: 1. Right flank pain   2. Fall   3. Closed fracture of multiple ribs of right side, initial encounter   4. Traumatic pneumothorax, initial encounter   5. Multiple rib fractures   6. Respiratory failure (HCC)     Disposition: Expired  This note was prepared with assistance of Dragon voice recognition software. Occasional wrong-word or sound-a-like substitutions may have occurred due to the inherent limitations of voice recognition software.       Maura Braaten, Canary Brim, MD 03/03/2020 [id number]

## 2020-03-18 NOTE — Code Documentation (Signed)
Pulse check, continue chest compressions. 

## 2020-03-18 NOTE — Progress Notes (Signed)
Pt brought in through ems. Placed on vent per ems setting due to dysynchrony pt mode changed multiple times. Pt coded multiple times. MD called TOD at 2237.

## 2020-03-18 NOTE — Code Documentation (Signed)
Pause cpr pulse check, continue CPR.

## 2020-03-18 NOTE — Code Documentation (Signed)
Pulse check, pulse present

## 2020-03-18 NOTE — ED Notes (Signed)
Report called to Delorise Jackson RN, trauma MD requested ED to ED transfer ASAP, [facility] here for transport and aware

## 2020-03-18 NOTE — H&P (Signed)
History   Martin Leach is an 69 y.o. male.   Chief Complaint:  Chief Complaint  Patient presents with  . Fall    Pt is a 69 yo M who sustained a fall when he was going up a handicap ramp.  He was at the top and holding on to one side rail while attempting to open the storm door.  He then fell over the right side, striking his right chest and leg.  He complains of chest pain.  He denies abdominal pain.  He did not sustain any head trauma of which he is aware.  No n/v.  No dizziness.  No LOC.    He hasn't checked his blood sugars in over 2 years despite being diabetic.     Past Medical History:  Diagnosis Date  . Diabetes mellitus without complication (HCC)   . Hyperlipidemia   . Hypertension     Past Surgical History:  Procedure Laterality Date  . 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 TONSILLECTOMY      Family History  Problem Relation Age of Onset  . Heart attack Mother   . Stroke Mother   . Stroke Father    Social History:  reports that he has never smoked. He has never used smokeless tobacco. He reports current alcohol use. He reports that he does not use drugs.  Allergies   Allergies  Allergen Reactions  . Codeine   . Sulfa Antibiotics     Home Medications  (Not in a hospital admission)   Trauma Course   Results for orders placed or performed during the hospital encounter of 03/07/2020 (from the past 48 hour(s))  CBG monitoring, ED     Status: Abnormal   Collection Time: 03/07/20  8:52 PM  Result Value Ref Range   Glucose-Capillary 220 (H) 70 - 99 mg/dL    Comment: Glucose reference range applies only to samples taken after fasting for at least 8 hours.  CBC     Status: Abnormal   Collection Time: 03-07-20  9:20 PM  Result Value Ref Range   WBC 9.1 4.0 - 10.5 K/uL   RBC 4.21 (L) 4.22 - 5.81 MIL/uL   Hemoglobin 12.9 (L) 13.0 - 17.0 g/dL   HCT 15.1 39 - 52 %   MCV 96.2 80.0 - 100.0 fL   MCH 30.6 26.0 - 34.0 pg   MCHC 31.9 30.0 - 36.0 g/dL   RDW 76.1  60.7 - 37.1 %   Platelets 248 150 - 400 K/uL   nRBC 0.0 0.0 - 0.2 %    Comment: Performed at [facility], [address], [place], [place] [address]  Comprehensive metabolic panel     Status: Abnormal   Collection Time: Mar 07, 2020  9:20 PM  Result Value Ref Range   Sodium 140 135 - 145 mmol/L   Potassium 4.0 3.5 - 5.1 mmol/L   Chloride 106 98 - 111 mmol/L   CO2 25 22 - 32 mmol/L   Glucose, Bld 219 (H) 70 - 99 mg/dL    Comment: Glucose reference range applies only to samples taken after fasting for at least 8 hours.   BUN 27 (H) 8 - 23 mg/dL   Creatinine, Ser 4.85 0.61 - 1.24 mg/dL   Calcium 8.6 (L) 8.9 - 10.3 mg/dL   Total Protein 7.0 6.5 - 8.1 g/dL   Albumin 3.7 3.5 - 5.0 g/dL   AST 58 (H) 15 - 41 U/L   ALT  44 0 - 44 U/L   Alkaline Phosphatase 71 38 - 126 U/L   Total Bilirubin 0.6 0.3 - 1.2 mg/dL   GFR calc non Af Amer 60 (L) >60 mL/min   GFR calc Af Amer >60 >60 mL/min   Anion gap 9 5 - 15    Comment: Performed at [facility], [address], [place], [place] [address]  Lipase, blood     Status: None   Collection Time: 03/23/2020  9:20 PM  Result Value Ref Range   Lipase 23 11 - 51 U/L    Comment: Performed at [facility], [address], [place], [place] [address]   DG Knee 2 Views Right  Result Date: 2020-03-23 CLINICAL DATA:  Fall, right knee pain EXAM: RIGHT KNEE - 1-2 VIEW COMPARISON:  None. FINDINGS: Two-view radiograph of the right knee demonstrates normal alignment. No fracture or dislocation. Mild medial compartment degenerative arthritis. No effusion. Soft tissues are unremarkable. IMPRESSION: Negative. Electronically Signed   By: Helyn Numbers MD   On: 2020/03/23 21:25   CT Head Wo Contrast  Result Date: 03/23/20 CLINICAL DATA:  Status post fall. EXAM: CT HEAD WITHOUT CONTRAST TECHNIQUE: Contiguous axial images were obtained from the base of the skull through the vertex without intravenous contrast.  COMPARISON:  None. FINDINGS: Brain: There is mild cerebral atrophy with widening of the extra-axial spaces and ventricular dilatation. There are areas of decreased attenuation within the white matter tracts of the supratentorial brain, consistent with microvascular disease changes. Cavum septum pellucidum is noted. Vascular: No hyperdense vessel or unexpected calcification. Skull: Normal. Negative for fracture or focal lesion. Sinuses/Orbits: No acute finding. Other: None. IMPRESSION: 1. Generalized cerebral atrophy. 2. No acute intracranial abnormality. Electronically Signed   By: Aram Candela M.D.   On: 2020/03/23 23:18   CT Cervical Spine Wo Contrast  Result Date: 03/23/20 CLINICAL DATA:  Status post fall. EXAM: CT CERVICAL SPINE WITHOUT CONTRAST TECHNIQUE: Multidetector CT imaging of the cervical spine was performed without intravenous contrast. Multiplanar CT image reconstructions were also generated. COMPARISON:  None. FINDINGS: Alignment: Normal. Skull base and vertebrae: No acute fracture. An 8 mm sclerotic focus seen along the left transverse process of the T2 vertebral body. Soft tissues and spinal canal: No prevertebral fluid or swelling. No visible canal hematoma. Disc levels: Mild multi the endplate sclerosis is seen with mild multilevel intervertebral disc space narrowing. There is mild bilateral multilevel facet joint hypertrophy is noted. Upper chest: Negative. Other: None. IMPRESSION: 1. No acute fracture or subluxation of the cervical spine. 2. 8 mm sclerotic focus seen along the left transverse process of the T2 vertebral body. This may represent a bone island. Electronically Signed   By: Aram Candela M.D.   On: 03/23/2020 23:21   CT CHEST ABDOMEN PELVIS W CONTRAST  Result Date: 23-Mar-2020 CLINICAL DATA:  Fall with right flank pain. EXAM: CT CHEST, ABDOMEN, AND PELVIS WITH CONTRAST TECHNIQUE: Multidetector CT imaging of the chest, abdomen and pelvis was performed following the  standard protocol during bolus administration of intravenous contrast. CONTRAST:  OMNIPAQUE IOHEXOL 300 MG/ML  SOLN COMPARISON:  CT dated Dec 27, 2017. FINDINGS: CT CHEST FINDINGS Cardiovascular: The heart size is normal. There is no evidence for thoracic aortic dissection. Mild atherosclerotic changes are noted. There is no large centrally located pulmonary embolism. Mediastinum/Nodes: -- No mediastinal lymphadenopathy. -- No hilar lymphadenopathy. -- No axillary lymphadenopathy. -- No supraclavicular lymphadenopathy. --again noted is a pedunculated thyroid nodule measuring approximately  2.1 cm (axial series 3, image 5). -  Unremarkable esophagus. Lungs/Pleura: There is a small right-sided pneumothorax. There is atelectasis at the right lung base. There is a small right-sided pleural effusion. The trachea is unremarkable Musculoskeletal: There are acute displaced and nondisplaced fractures involving the third through the third through ninth ribs on the right. CT ABDOMEN PELVIS FINDINGS Hepatobiliary: The liver is normal. Normal gallbladder.There is no biliary ductal dilation. Pancreas: Normal contours without ductal dilatation. No peripancreatic fluid collection. Spleen: Unremarkable. Adrenals/Urinary Tract: --Adrenal glands: Unremarkable. --Right kidney/ureter: No hydronephrosis or radiopaque kidney stones. --Left kidney/ureter: There is a solid-appearing mass arising from the upper pole the left kidney measuring approximately 6.3 x 6.1 cm (axial series 3, image 73). --Urinary bladder: There is a 2.3 cm stone in the urinary bladder. Stomach/Bowel: --Stomach/Duodenum: No hiatal hernia or other gastric abnormality. Normal duodenal course and caliber. --Small bowel: Unremarkable. --Colon: Rectosigmoid diverticulosis without acute inflammation. --Appendix: Normal. Vascular/Lymphatic: Normal course and caliber of the major abdominal vessels. --No retroperitoneal lymphadenopathy. --No mesenteric lymphadenopathy.  --No pelvic or inguinal lymphadenopathy. Reproductive: The prostate gland is enlarged. Other: No ascites or free air. There are bilateral fat containing inguinal hernias. There is a fat containing umbilical hernia. Musculoskeletal. No acute displaced fractures. IMPRESSION: 1. Small right-sided pneumothorax. 2. Acute displaced and nondisplaced fractures involving the third through ninth ribs on the right. 3. No acute intra-abdominal or pelvic injury. 4. Solid-appearing mass arising from the upper pole the left kidney measuring up to 6.3 cm. This is concerning for renal cell carcinoma until proven otherwise. Outpatient urology follow-up is recommended. 5. A 2.3 cm stone in the urinary bladder. 6. Enlarged prostate gland. 7. There is a 2.1 cm pedunculated thyroid nodule on the right. Recommend thyroid US (ref: J Am Coll Radiol. 2015 Feb;12(2): 143-50). Aortic Atherosclerosis (ICD10-I70.0). Electronically Signed   By: Katherine Mantlehristopher  Green M.D.   On: 02/17/2020 23:27   DG Chest Portable 1 View  Result Date: 03/17/2020 CLINICAL DATA:  Status post fall. EXAM: PORTABLE CHEST 1 VIEW COMPARISON:  Dec 26, 2017 FINDINGS: Mild left basilar atelectasis and/or infiltrate is seen. A very small right-sided pleural effusion is noted. No pneumothorax is identified. The cardiac silhouette is moderately enlarged with prominent pericardial fat seen along the left lung base. An acute third right rib fracture is seen. IMPRESSION: 1. Mild left basilar atelectasis and/or infiltrate. 2. Very small right-sided pleural effusion. Electronically Signed   By: Aram Candelahaddeus  Houston M.D.   On: 03/03/2020 23:37   DG Femur Min 2 Views Right  Result Date: 03/01/2020 CLINICAL DATA:  Fall, right hip pain EXAM: RIGHT FEMUR 2 VIEWS COMPARISON:  None. FINDINGS: Two view radiograph of the right femur demonstrates normal alignment. No fracture or dislocation. No knee effusion. Soft tissues are unremarkable. IMPRESSION: Negative. Electronically Signed   By:  Helyn NumbersAshesh  Parikh MD   On: 02/19/2020 21:24    Review of Systems  Constitutional: Negative.   HENT: Negative.   Eyes: Negative.   Respiratory: Positive for shortness of breath.   Cardiovascular: Positive for chest pain.  Gastrointestinal: Negative.   Endocrine: Negative.   Genitourinary: Negative.   Musculoskeletal: Negative.   Allergic/Immunologic: Negative.   Neurological: Negative.   Hematological: Negative.   Psychiatric/Behavioral: Negative.     Blood pressure (!) 151/85, pulse 87, temperature 97.9 F (36.6 C), temperature source Oral, resp. rate 19, height 6\' 1"  (1.854 m), weight (!) 156 kg, SpO2 98 %. Physical Exam Constitutional:      General: Distressed: looks sl uncomfortable.  Appearance: Normal appearance. He is obese. He is not ill-appearing, toxic-appearing or diaphoretic.  HENT:     Head: Normocephalic and atraumatic.     Right Ear: External ear normal.     Left Ear: External ear normal.  Eyes:     General: No scleral icterus.       Right eye: No discharge.        Left eye: No discharge.     Extraocular Movements: Extraocular movements intact.     Conjunctiva/sclera: Conjunctivae normal.     Pupils: Pupils are equal, round, and reactive to light.  Cardiovascular:     Rate and Rhythm: Normal rate and regular rhythm.     Pulses: Normal pulses.     Heart sounds: Normal heart sounds. No murmur heard.  No friction rub. No gallop.   Pulmonary:     Effort: Pulmonary effort is normal. No respiratory distress.     Breath sounds: Normal breath sounds. No stridor. No wheezing, rhonchi or rales.  Chest:     Chest wall: Tenderness (right lateral) present.  Abdominal:     General: Bowel sounds are normal. There is no distension.     Palpations: There is no mass.     Tenderness: There is no abdominal tenderness. There is no guarding or rebound.     Hernia: A hernia (umbilical hernia, around 1.5 cm) is present.     Comments: Protuberant, but baseline per patient.     Musculoskeletal:        General: Tenderness, deformity and signs of injury present. Normal range of motion.     Cervical back: Normal range of motion and neck supple. No rigidity or tenderness.     Comments: Bruising and tenderness right knee and ankle  Lymphadenopathy:     Cervical: No cervical adenopathy.  Skin:    General: Skin is warm and dry.     Capillary Refill: Capillary refill takes 2 to 3 seconds.     Coloration: Skin is not jaundiced or pale.     Findings: Bruising present. No erythema, lesion or rash.  Neurological:     General: No focal deficit present.     Mental Status: He is alert and oriented to person, place, and time. Mental status is at baseline.     Cranial Nerves: No cranial nerve deficit.     Sensory: No sensory deficit.     Motor: No weakness.  Psychiatric:        Mood and Affect: Mood normal.        Behavior: Behavior normal.        Thought Content: Thought content normal.        Judgment: Judgment normal.     Assessment/Plan  69 yo M s/p fall. Right rib fractures 3-9 Trace PTX Small right pleural effusion Right knee and ankle pain DM Left renal mass  Thyroid nodule  Will admit for pulmonary toilet and observation over the trace pneumothorax Pain control Will need PT once ankle films are cleared.  Knee films are negative.  Will need SSI.   Discussed risk of pneumonia with patient and importance of deep breathing.    Will need outpatient follow up for renal mass and thyroid mass.    Almond Lint 02/16/2020, 12:14 AM   Procedures

## 2020-03-18 NOTE — ED Notes (Signed)
Initiated at [facility] [facility]

## 2020-03-18 NOTE — ED Notes (Signed)
Patient transported to CT 

## 2020-03-18 NOTE — ED Provider Notes (Signed)
Procedure Name: Intubation Date/Time: 03-07-2020 8:47 PM Performed by: Sabino Donovan, MD Pre-anesthesia Checklist: Suction available, Emergency Drugs available, Patient being monitored and Timeout performed Oxygen Delivery Method: Ambu bag Preoxygenation: Pre-oxygenation with 100% oxygen Ventilation: Two handed mask ventilation required Laryngoscope Size: Glidescope and 3 Grade View: Grade II Tube size: 7.5 mm Number of attempts: 2 Airway Equipment and Method: Bougie stylet Placement Confirmation: ETT inserted through vocal cords under direct vision,  Positive ETCO2,  CO2 detector and Breath sounds checked- equal and bilateral Secured at: 24 cm Tube secured with: ETT holder Difficulty Due To: Difficult Airway- due to reduced neck mobility and Difficult Airway- due to limited oral opening       Of is called the patient bedside as he is being transported by EMS to [facility], well to the ground after syncopal episode he had agonal respirations and eventually lost his pulse.  CPR was initiated immediately.  2 rounds of IV epinephrine were given with PEA arrest in between pulse checks.  ROSC was obtained in epinephrine boluses were given while waiting for an epinephrine infusion.  Calcium gluconate was also given.  His postarrest EKG showed low voltage wide-complex at a rate of 60 bpm.  He was intubated shortly after that procedure as described above.  Shortly thereafter his blood pressure continued to drop.  Despite epinephrine infusion he did arrest again.  He was PEA again.  Additional epinephrine was given.  ET tube was placed and the first arrest was replaced as it may have been too superior.  After that was secured ROSC was obtained again.  His blood pressure remained more stable and the epinephrine infusion was continued.  I spoke with a cardiologist after a second postarrest EKG was obtained and he does not feel that it needs emergent catheterization or trip to the Cath Lab.  He recommends  post PEA arrest care.  X-rays were obtained for both rest and ET tube placement as the first 1 was to superior segment appears better.  Both films I see lung markings at all parts of the lungs.  The previously seen pneumothorax on CT imaging from yesterday was not apparent.  He had bilateral breath sounds throughout the entire resuscitation.  Our general surgeon on-call here was at bedside as well and evaluated the patient and agreed with no emergent need for chest tube placement this time.  Patient is placed in a hard collar and transported via critical care transport to [facility].  The emergency department at [facility] was notified of the patient's transport.  Cardiopulmonary Resuscitation (CPR) Procedure Note Directed/Performed by: Sabino Donovan I personally directed ancillary staff and/or performed CPR in an effort to regain return of spontaneous circulation and to maintain cardiac, neuro and systemic perfusion.    CRITICAL CARE Performed by: Sabino Donovan   Total critical care time: 60 minutes  Critical care time was exclusive of separately billable procedures and treating other patients.  Critical care was necessary to treat or prevent imminent or life-threatening deterioration.  Critical care was time spent personally by me on the following activities: development of treatment plan with patient and/or surrogate as well as nursing, discussions with consultants, evaluation of patient's response to treatment, examination of patient, obtaining history from patient or surrogate, ordering and performing treatments and interventions, ordering and review of laboratory studies, ordering and review of radiographic studies, pulse oximetry and re-evaluation of patient's condition.    Sabino Donovan, MD 03-07-2020 2053

## 2020-03-18 NOTE — ED Notes (Signed)
Wedding ring found in patient's room. Given ring to security and contacted patient's son who will pick it up tomorrow.

## 2020-03-18 NOTE — Progress Notes (Signed)
Received a called from [facility] re: change in clinical status. Report that the patient was being assisted to stand by nursing staff and syncopized, falling and hitting his head, followed by cardiac arrest, now intubated. Requested transfer immediately to [facility] for additional workup prior to ICU admission. On arrival to [facility], patient was on a high dose epi gtt via PIV, no other medications. Report from EMS of two additional occurrences of cardiac arrest prior to transport. Initial assessment in ED notable for reactive pupils, spontaneous respiratory effort, poor oxygenation requiring escalation in PEEP. PEEP increased to 14 prior to transport to CT, pulseox with sats mid-80s. In CT, sats persistently low, PEEP increased to 18. CT notable for trace PTX on R and infiltrate on R. Upon transport back to ED, patient went into a bradycardic arrest, with CPR and ROSC. During this period, emergent central line placed in R groin. Plan for transport to ICU, however just prior to transport, again had a bradycardic arrest. ACLS initiated, R sided chest tube placed. I called the wife during this round of CPR and she stated she was aware of the change in status at [facility]. I notified her of current resuscitative efforts and she requested maximal medical interventions be performed. ACLS continued for >20 minutes without return of spontaneous circulation. Time of death called at [id number]. I called the wife again to notify her of time of death.   Critical care time:  Procedure Note  Date: 02/19/2020  Procedure: central venous catheter placement--right, femoral vein, without ultrasound guidance  Pre-op diagnosis: ACLS Post-op diagnosis: same  Surgeon: Diamantina Monks, MD  Anesthesia: none  EBL: <5cc Drains/Implants:  triple lumen central venous catheter  Description of procedure: This procedure was performed emergently and therefore informed consent was not obtained. The right femoral vein was accessed using anatomic  landmarks and an introducer needle and a guidewire passed through the needle. The needle was removed and a skin nick was made. The tract was dilated and the central venous catheter advanced over the guidewire followed by removal of the guidewire. All ports drew blood easily and all were flushed with saline. The catheter was secured to the skin with suture and a sterile dressing. The patient tolerated the procedure well. There were no immediate complications.   Procedure Note  Date: 03/12/2020  Procedure: tube thoracostomy--right    Pre-op diagnosis: right pneumothorax  Post-op diagnosis: same  Surgeon: Diamantina Monks, MD  Anesthesia: none  EBL: <5cc procedural; 100cc blood evacuated Drains/Implants: 54F chest tube Specimen: none  Description of procedure: This procedure was performed emergently and therefore informed consent was not obtained. A longitudinal incision was made parallel to the rib at the fourth intercostal space. This incision was deepened down through the muscle until the pleural cavity was entered. A 54F chest tube was inserted through this tract. The tube was secured at the skin with suture and connected to an atrium at -20cm water wall suction. Immediate output from the chest tube was 100cc and was bloody.     Diamantina Monks, MD General and Trauma Surgery [facility]   '

## 2020-03-18 NOTE — ED Notes (Signed)
Rest of Fentanyl and Versed drips wasted in sink with Vicotria, RN as witness.

## 2020-03-18 NOTE — Code Documentation (Signed)
Pulse check, continue chest compressions.

## 2020-03-18 NOTE — Code Documentation (Signed)
Pulse check, continue compressions. 

## 2020-03-18 NOTE — Code Documentation (Addendum)
Pulse check, no pulse.Heart US done by Dr. Bedelia Person, continue compressions.

## 2020-03-18 NOTE — ED Notes (Signed)
Martin Leach [phone] (wife)

## 2020-03-18 NOTE — Progress Notes (Signed)
Called to ed10 for code in progress around 1920.  Pt intubated by ed md using glidescope without difficulty.  7.5ett placed initially at 22cm.  ETT later exchanged for a new 7.5 tube secured at 24cm.  Proper placement verified by ed physician.  ETCO2 reading was 26 after reintubation.  Pt bagged with bmv from onset of code til pt left via [facility].  Report given to receiving RT at [facility] ED.

## 2020-03-18 NOTE — Code Documentation (Signed)
Pulse check, no pulse present 

## 2020-03-18 NOTE — Progress Notes (Signed)
2100pm Pt transported to Ct. AT 2130 pt was transported from CT to rm 30 in the ED.

## 2020-03-18 DEATH — deceased
